# Patient Record
Sex: Female | Born: 1945 | Race: White | Hispanic: No | Smoking: Never smoker
Health system: Southern US, Community
[De-identification: ages and names within clinical notes are randomized; demographics above are authoritative.]

## PROBLEM LIST (undated history)

## (undated) DIAGNOSIS — I10 Essential (primary) hypertension: Secondary | ICD-10-CM

## (undated) DIAGNOSIS — F32A Depression, unspecified: Secondary | ICD-10-CM

## (undated) DIAGNOSIS — T4145XA Adverse effect of unspecified anesthetic, initial encounter: Secondary | ICD-10-CM

## (undated) DIAGNOSIS — F329 Major depressive disorder, single episode, unspecified: Secondary | ICD-10-CM

## (undated) DIAGNOSIS — C44509 Unspecified malignant neoplasm of skin of other part of trunk: Secondary | ICD-10-CM

## (undated) DIAGNOSIS — Z87442 Personal history of urinary calculi: Secondary | ICD-10-CM

## (undated) DIAGNOSIS — R112 Nausea with vomiting, unspecified: Secondary | ICD-10-CM

## (undated) DIAGNOSIS — G4730 Sleep apnea, unspecified: Secondary | ICD-10-CM

## (undated) DIAGNOSIS — Z8489 Family history of other specified conditions: Secondary | ICD-10-CM

## (undated) DIAGNOSIS — E119 Type 2 diabetes mellitus without complications: Secondary | ICD-10-CM

## (undated) DIAGNOSIS — M1990 Unspecified osteoarthritis, unspecified site: Secondary | ICD-10-CM

## (undated) DIAGNOSIS — Z9889 Other specified postprocedural states: Secondary | ICD-10-CM

## (undated) DIAGNOSIS — F419 Anxiety disorder, unspecified: Secondary | ICD-10-CM

## (undated) DIAGNOSIS — R0600 Dyspnea, unspecified: Secondary | ICD-10-CM

## (undated) DIAGNOSIS — T8859XA Other complications of anesthesia, initial encounter: Secondary | ICD-10-CM

## (undated) HISTORY — PX: BREAST SURGERY: SHX581

## (undated) HISTORY — DX: Type 2 diabetes mellitus without complications: E11.9

## (undated) HISTORY — DX: Morbid (severe) obesity due to excess calories: E66.01

## (undated) HISTORY — DX: Depression, unspecified: F32.A

## (undated) HISTORY — PX: KNEE SURGERY: SHX244

## (undated) HISTORY — PX: TUBAL LIGATION: SHX77

## (undated) HISTORY — DX: Major depressive disorder, single episode, unspecified: F32.9

## (undated) HISTORY — PX: EYE SURGERY: SHX253

## (undated) HISTORY — DX: Essential (primary) hypertension: I10

---

## 1999-10-02 ENCOUNTER — Ambulatory Visit (HOSPITAL_COMMUNITY): Admission: RE | Admit: 1999-10-02 | Discharge: 1999-10-02 | Payer: Self-pay | Admitting: Internal Medicine

## 1999-10-02 ENCOUNTER — Encounter: Payer: Self-pay | Admitting: Internal Medicine

## 1999-11-06 ENCOUNTER — Ambulatory Visit (HOSPITAL_COMMUNITY): Admission: RE | Admit: 1999-11-06 | Discharge: 1999-11-06 | Payer: Self-pay | Admitting: Internal Medicine

## 1999-11-06 ENCOUNTER — Encounter: Payer: Self-pay | Admitting: Internal Medicine

## 2000-05-11 ENCOUNTER — Encounter: Payer: Self-pay | Admitting: Endocrinology

## 2000-05-11 ENCOUNTER — Encounter: Payer: Self-pay | Admitting: Emergency Medicine

## 2000-05-11 ENCOUNTER — Inpatient Hospital Stay (HOSPITAL_COMMUNITY): Admission: EM | Admit: 2000-05-11 | Discharge: 2000-05-13 | Payer: Self-pay | Admitting: Emergency Medicine

## 2000-05-12 ENCOUNTER — Encounter: Payer: Self-pay | Admitting: Internal Medicine

## 2001-12-29 ENCOUNTER — Other Ambulatory Visit: Admission: RE | Admit: 2001-12-29 | Discharge: 2001-12-29 | Payer: Self-pay | Admitting: Internal Medicine

## 2003-06-28 ENCOUNTER — Other Ambulatory Visit: Admission: RE | Admit: 2003-06-28 | Discharge: 2003-06-28 | Payer: Self-pay | Admitting: Internal Medicine

## 2004-02-20 ENCOUNTER — Emergency Department (HOSPITAL_COMMUNITY): Admission: EM | Admit: 2004-02-20 | Discharge: 2004-02-20 | Payer: Self-pay | Admitting: Family Medicine

## 2004-05-04 ENCOUNTER — Observation Stay (HOSPITAL_COMMUNITY): Admission: RE | Admit: 2004-05-04 | Discharge: 2004-05-05 | Payer: Self-pay | Admitting: Orthopedic Surgery

## 2004-12-29 ENCOUNTER — Emergency Department (HOSPITAL_COMMUNITY): Admission: EM | Admit: 2004-12-29 | Discharge: 2004-12-29 | Payer: Self-pay | Admitting: Emergency Medicine

## 2005-01-03 ENCOUNTER — Ambulatory Visit (HOSPITAL_COMMUNITY): Admission: RE | Admit: 2005-01-03 | Discharge: 2005-01-03 | Payer: Self-pay | Admitting: Emergency Medicine

## 2005-02-11 ENCOUNTER — Other Ambulatory Visit: Admission: RE | Admit: 2005-02-11 | Discharge: 2005-02-11 | Payer: Self-pay | Admitting: Gynecology

## 2005-02-11 ENCOUNTER — Encounter: Admission: RE | Admit: 2005-02-11 | Discharge: 2005-02-11 | Payer: Self-pay | Admitting: Internal Medicine

## 2007-09-10 ENCOUNTER — Encounter: Admission: RE | Admit: 2007-09-10 | Discharge: 2007-09-10 | Payer: Self-pay | Admitting: Gynecology

## 2007-11-10 ENCOUNTER — Observation Stay (HOSPITAL_COMMUNITY): Admission: EM | Admit: 2007-11-10 | Discharge: 2007-11-11 | Payer: Self-pay | Admitting: Emergency Medicine

## 2007-11-10 ENCOUNTER — Ambulatory Visit: Payer: Self-pay | Admitting: Cardiology

## 2007-11-11 ENCOUNTER — Encounter (INDEPENDENT_AMBULATORY_CARE_PROVIDER_SITE_OTHER): Payer: Self-pay | Admitting: Internal Medicine

## 2007-11-11 ENCOUNTER — Ambulatory Visit: Payer: Self-pay | Admitting: Vascular Surgery

## 2007-12-11 ENCOUNTER — Encounter (INDEPENDENT_AMBULATORY_CARE_PROVIDER_SITE_OTHER): Payer: Self-pay | Admitting: Gynecology

## 2007-12-11 ENCOUNTER — Ambulatory Visit (HOSPITAL_BASED_OUTPATIENT_CLINIC_OR_DEPARTMENT_OTHER): Admission: RE | Admit: 2007-12-11 | Discharge: 2007-12-11 | Payer: Self-pay | Admitting: Dermatology

## 2010-11-20 NOTE — Discharge Summary (Signed)
NAMEJANIJAH, SYMONS            ACCOUNT NO.:  [id number]   MEDICAL RECORD NO.:  [id number]          PATIENT TYPE:  OBV   LOCATION:  3734                         FACILITY:  [facility]   PHYSICIAN:  Marcellus Murata, MD     DATE OF BIRTH:  [birth year]   DATE OF ADMISSION:  11/10/2007  DATE OF DISCHARGE:  11/11/2007                               DISCHARGE SUMMARY   PRIMARY MEDICAL DOCTOR:  Georgianne Fick, M.D.   DISCHARGE DIAGNOSES:  1. Atypical chest pain, likely noncardiac.  2. Left anterior neck pain, unclear etiology.  3. Hypertension.  4. Obesity.  5. Dyslipidemia.  6. Anemia.   DISCHARGE MEDICATIONS:  1. Quinapril 20 mg p.o. twice daily.  2. Lexapro 20 mg p.o. daily.  3. Aspirin, enteric-coated, 81 mg p.o. daily.  4. Ketoconazole cream 2%, apply to affected areas as directed.  5. Ativan 0.5 mg p.o. b.i.d. p.r.n.  6. Prilosec over-the-counter 20 mg p.o. daily p.r.n.   Except for the Prilosec and Ativan, the rest of them are patient's home  medications.  These were obtained from the patient's pharmacy by the  nurses on admission.   PROCEDURES:  1. Carotid Dopplers, a report is pending.  2. Chest x-ray, which has prominent basilar markings, which are likely      chronic.  No definite acute cardiopulmonary process.   PERTINENT LABS:  TSH 1.920.  Cardiac panel cycled x3 and negative.  Comprehensive metabolic panel unremarkable with BUN of 10, creatinine  0.77.  Lipid panel with cholesterol 201, triglycerides 92, HDL 38, LDL  145, VLDL 18.  CBC is with hemoglobin 11.1, hematocrit 33.3, white blood  cells 6.2, platelets 201.  Urinalysis with 3-6 white blood cells per  high-powered field and a few bacteria.  A D-dimer negative at 0.22.   CONSULTATIONS:  Cardiology, Dr. Myrtis Ser.   HOSPITAL COURSE/PATIENT DISPOSITION:  Please refer to the history and  physical note for initial admission details.  In summary, Ms. Cathlean Cower is  a pleasant 65 year old Caucasian female patient with a  history of  hypertension, hyperlipidemia, vaginal bleeding, who presented with  approximately three or more weeks of pain underneath the left breast and  left anterior neck pain.  On the day of admission, she had persisting  3/10 pain with associated episodes of anxiety with hot and cold  sensation.  The patient was thereby admitted for 23-hour observation to  rule out acute coronary syndrome.   Atypical chest pain:  Patient was admitted to telemetry with no  arrhythmia alarms.  Her cardiac enzymes were cycled and negative.  D-  dimer was negative.  Given her risk factors for obesity and  dyslipidemia, a cardiology consult was obtained.  They evaluated the  patient and have indicated that there is no proof there is any  significant cardiac abnormality, and they suggest no further cardiac  workup except for reassurance.  They suspect that this is probably a  musculoskeletal pain.   Left anterior neck pain:  Again, ongoing for weeks, which is 3/10 in  severity.  There is no tenderness on manual palpation and no goiter or  lymphadenopathy.  The  carotid Dopplers were obtained for completion, the  report of which is pending.  Patient is to call back tomorrow for the  report.   Hypertension, which is fairly controlled:  The patient had initially  indicated that she was on quinapril and hydrochlorothiazide; however,  she apparently is only on quinapril at a higher dose at home.  To  continue home medications.   Obesity:  Patient is to have exercise, dieting and weight loss.   Dyslipidemia:  To consider statins as an outpatient on followup with her  PMD.   Anemia, which is probably related to her vaginal bleeding.  She is to  have surgery in a month's time.   Anxiety disorder:  We will provide a small quantity of Ativan p.r.n.  Patient is to follow up with her primary medical doctor for further  evaluation.    Addendum: Carotid Dopplers:  SUMMARY:    -  Mild mix plaque throughout  bilaterally.     -  Vertebral artery flow antegrade bilaterally.    -  No significant right ICA stenosis noted.    -  No significant left ICA stenosis noted.      Marcellus Scheeler, MD  Electronically Signed     AH/MEDQ  D:  11/11/2007  T:  11/11/2007  Job:  [id number]   cc:   Georgianne Fick, M.D.  Luis Abed, MD, [facility]

## 2010-11-20 NOTE — Consult Note (Signed)
[name], DELANGE NO.:  [id number]   MEDICAL RECORD NO.:  [id number]          PATIENT TYPE:  OBV   LOCATION:  3734                         FACILITY:  [facility]   PHYSICIAN:  Luis Abed, MD, FACCDATE OF BIRTH:  07/07/1946   DATE OF CONSULTATION:  11/11/2007  DATE OF DISCHARGE:  11/11/2007                                 CONSULTATION   SUMMARY OF HISTORY:  Lindsey Sanders is a 65 year old white female who was  admitted through [facility] emergency room secondary to weakness, and  chest discomfort.  Lindsey Sanders states when she got out of bed yesterday  at 5:30.  She felt a wave of weakness that went from her head to her  toes that lasted less than 5 minutes.  With this, she had a sense of  hopelessness like she was not control with what was happening.  She  states that she was oriented to person, place, and time and did not have  any neurological deficit.  She states she rested with improvement.  However, she had two other episodes one right after another.  While  driving to work, she state she had a light spell.  While at work at  approximately 7:00 a.m., she had another episode with standing.  Due to  these feelings, she returned home and her son drove her to the emergency  room around 9 o'clock for further evaluation.  Since her admission, she  states she has continued to have light episodes.  With this, she is also  noticed an anxiousness.  She states that these feelings are similar to  prior feelings that she had with her husband died approximately 4 years  ago, and she was diagnosed with depression and panic attacks.  She  denies any problems with depression; however, she states she is under a  lot of stress at work.  She has recently had fired 2 people and more  more demands have been made of her at work.  Her husband's birthday was  also within the last couple weeks and she states that was very bad time  for her.   ALLERGIES:  She is allergic to:  1.  CODEINE.  2. PENICILLIN.  3. ZESTRIL.   MEDICATIONS:  Prior to admission include:  1. Ketoconazole p.r.n.  2. Quinapril 20 b.i.d.  3. Meclizine 25 p.r.n.  4. Aspirin 81 daily.  5. Lexapro 20 daily.  6. Tagamet p.r.n.   PAST MEDICAL HISTORY:  1. She relates a history of hypertension.  2. Obesity.  3. Hyperlipidemia.  4. Benign right breast biopsy.  5. Bilateral renal cyst.  6. Uterine leiomyomata.  7. Right knee surgery in 2006.  8. She is specifically denies diabetes, myocardial infarction, CVA,      COPD, thyroid dyscrasias, renal dysfunction, or bleeding disorders.   SOCIAL HISTORY:  She resides in [place] alone.  She has 1 son, 2  grandchildren.  She has been widowed for 4 years.  She was previously  employed with [employer] at the time of the bankruptcy, now she works  with an Insurance claims handler.  She denies any tobacco,  alcohol,  drug usage.  She has not exercised since November 2007 secondary to her  problems with vaginal bleeding.  She denies any alcohol, drugs, or  specific diet.   FAMILY HISTORY:  Mother died at the age of 64 with a history of  diabetes, Alzheimer and Meniere.  Her father died at the age 88 with  COPD.  She has 9 brothers and sisters that are living.  Two siblings  died in infancy.   REVIEW OF SYSTEMS:  In addition to the above is notable anywhere from a  30- to 60-pound weight gain since 2006, reading glasses.  Recent upper  respiratory infection for which she had a cough and dyspnea on exertion,  but since this has resolved, she has not had any of these difficulties.  She also describes rashes underneath her breast and groin which she was  told that they were similar to Jock itch.  She also notices a sharp type  of chest discomfort that is particularly notable when she twist and  turn.  She has not had any limiting factors such as chest discomfort or  shortness of breath with activities.  She is also postmenopausal,  nocturia, and  GERD.   PHYSICAL EXAMINATION:  GENERAL:  Well-nourished and well-developed obese  white female, in no apparent distress.  VITAL SIGNS:  Temperature is 97.5, pulse 72, respirations 18, blood  pressure 121/82, T max is 99.2, and 96% sats on room air.  Admission  weight 117.2 kg.  HEENT:  Grossly unremarkable.  NECK:  Supple without thyromegaly, adenopathy, JVD, or carotid bruits.  CHEST:  Symmetrical excursion.  LUNGS:  Clear to auscultation.  HEART:  PMI is not displaced.  Regular rate and rhythm.  Normal S1 and  S2.  Do not appreciate any murmurs, rubs, clicks, or gallops.  All  pulses are symmetrical and intact.  SKIN:  Integument appears be intact.  ABDOMEN:  Obese.  Bowel sounds present without organomegaly, masses, or  tenderness.  EXTREMITIES:  Negative for cyanosis, clubbing, or edema.  MUSCULOSKELETAL:  Grossly unremarkable, although she does have  reproducible chest discomfort with palpation.  NEURO:  Unremarkable.   LABORATORY DATA:  Chest x-ray no active disease.  Abdominopelvic CT  showed bilateral renal cysts small uterine fibroids.  EKG shows normal  sinus rhythm, normal axis unchanged from prior EKG November 2005.  H and  H 11.8 and 35.2, normal indices, platelets 208, and WBC 7.2.  Sodium  140, potassium 3.9, BUN 10, creatinine 0.77, glucose 110, normal LFTs,  and D-dimer 0.22.  CK-MBs, relative indexes, and troponins have been  within normal limits x3.  Total cholesterol 201, triglycerides 92, HDL  38, and LDL 145.   IMPRESSION:  1. Weakness/panic attacks of uncertain etiology.  2. Probable musculoskeletal discomfort as it is reproduced on exam.      EKG enzymes, the patient's history does not point to an ischemic      etiology.   DISPOSITION:  Dr. Myrtis Ser reviewed the patient's history, spoke with and  examined the patient and agrees with the above.  Not feel that there is  need to pursue further cardiac workup at this time although, provide  reassurance.  She   should follow up with her primary care physician for aggressive risk  factor management, i.e., weight loss and regular exercise if her  Dopplers were okay.  If patient wishes to follow up with cardiology, Dr.  Myrtis Ser said he would be happy to see her in the office.  Joellyn Rued, PA-C      Luis Abed, MD, [facility]  Electronically Signed    EW/MEDQ  D:  11/11/2007  T:  11/12/2007  Job:  [id number]

## 2010-11-20 NOTE — Op Note (Signed)
NAMEJOLA, CRITZER            ACCOUNT NO.:  [id number]   MEDICAL RECORD NO.:  [id number]          PATIENT TYPE:  AMB   LOCATION:  NESC                         FACILITY:  [facility]   PHYSICIAN:  Gretta Cool, M.D. DATE OF BIRTH:  1946-06-19   DATE OF PROCEDURE:  12/11/2007  DATE OF DISCHARGE:                               OPERATIVE REPORT   PREOPERATIVE DIAGNOSIS:  Endometrial polyps with abnormal uterine  bleeding.   POSTOPERATIVE DIAGNOSIS:  Endometrial polyps with abnormal uterine  bleeding.   PROCEDURE:  Hysteroscopy, resection of multiple endometrial polyps,  total endometrial resection for ablation plus VaporTrode.   SURGEON:  Gretta Cool, M.D.   ANESTHESIA:  LMA, general, plus local infiltration.   DESCRIPTION OF PROCEDURE:  Under excellent anesthesia, as above, with  the patient prepped and draped in Allen stirrups, the bladder drained, a  single-tooth tenaculum was applied to the cervix and the cervix  progressively dilated with a series of Pratt dilators to 33.  The 7-mm  resectoscope was then introduced and the cavity photographed.  Multiple  large polyps were noted in the fundus of the uterus and one small one at  the cervix.  The polyps were aggressively resected.  The entire  endometrial cavity was then resected to a depth of 5 mm or more.  At  this point, VaporTrode electrode was applied and the cornual areas  treated by touch technique.  The entire cavity was then treated with  VaporTrode so as to eliminate any islands of active endometrium in the  superficial myometrium.  At this point with reduced pressure, there was  no significant bleeding.  The procedure was terminated without  complication.   The patient returned to the recovery room in excellent condition.           ______________________________  Gretta Cool, M.D.     CWL/MEDQ  D:  12/11/2007  T:  12/11/2007  Job:  [id number]   cc:   Georgianne Fick, M.D.  Fax: [phone]

## 2010-11-20 NOTE — H&P (Signed)
Lindsey Sanders            ACCOUNT NO.:  [id number]   MEDICAL RECORD NO.:  [id number]          PATIENT TYPE:  EMS   LOCATION:  MAJO                         FACILITY:  [facility]   PHYSICIAN:  Marcellus Laboy, MD     DATE OF BIRTH:  1946-03-27   DATE OF ADMISSION:  11/10/2007  DATE OF DISCHARGE:                              HISTORY & PHYSICAL   PRIMARY MEDICAL DOCTOR:  Georgianne Fick, M.D.   CHIEF COMPLAINT:  Chest pain, left-sided neck pain.   HISTORY OF PRESENTING ILLNESS:  [name] is a pleasant 65 year old  Caucasian female patient with history of hypertension, hyperlipidemia, a  renal cyst, vaginal bleeding that is being evaluated by her  gynecologist.  For the last two or three weeks, patient has been  experiencing pain underneath the left breast, which is intermittent  dull, aching 3/10 at its worst  Unclear about relationship to exertion,  but no pleuritic nature and nonradiating.  This has not been associated  with dyspnea.  She also complains of pain in the left anterior neck,  which is sharp, again 3/10 in severity, nonradiating.  She indicates  that over the last two to three days her chest pain has been more  constant and slightly worse than before.  In any event, patient went to  bed last night and woke up this morning with no symptoms. At  approximately 5:30 a.m, when she stood out of her bed, she started  having the dull aching underneath her left breast and was soon  afterwards followed by a hot and a cold sensation all over her body, and  felt she was going to pass out.  She sat down and her symptoms subsided.  These symptoms recurred through the course of the morning three more  times.  She in fact went to work where she also had associated hot and  cold sensation.  She indicates that these are similar to her panic  attacks that she had when she first was diagnosed with depression soon  after the passing of her husband four years ago.  Following this,  her  son brought her to the emergency room for further evaluation and  management.  She said she still has some amount of chest discomfort  intermittently.  She indicates that she has been stressed over the last  couple of weeks at her job.  There is no history of recent long distance  travel.  Apart from that, she denies any other complaints.   PAST MEDICAL HISTORY:  1. Hypertension.  2. Hyper lipidemia.  3. Renal cyst.  4. Fibroid uterus with vaginal bleeding.   PAST SURGICAL HISTORY:  1. Right breast cyst removal in the 80s, which was benign.  2. Right knee surgery.  3. History of outpatient uterine procedure and is supposed to have      another one done on December 11, 2007.    Psychiatric: history of depression.   ALLERGIES:  PENICILLIN - PASSES OUT.   MEDICATIONS:  1. Lexapro 10 mg p.o. daily.  2. Quinapril with HCTZ 20/12.5 one p.o. daily.  3. Aspirin 81 mg p.o. daily.  However, patient took 2 full strength      aspirins last night.   FAMILY HISTORY:  1. Mother died at age 12 of unclear reasons.  Patient thinks it might      have been heart disease.  Patient's mother also had Alzheimer's      dementia.  2. The patient's father died at age 5 years from emphysema.   SOCIAL HISTORY:  Patient is widowed.  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  There is no history of smoking, alcohol or drug  abuse.  Patient is menopausal for 20 years.   REVIEW OF SYSTEMS:  Weight gain of about 30 pounds over the last four  years secondary to immobility after her right knee surgery and  depression.  Fourteen  systems reviewed and apart from history of presenting illness,  is noncontributory.   PHYSICAL EXAMINATION:  [name] is a moderately built and obese  female.  Patient in no obvious distress.  VITAL SIGNS:  Temperature 98.6 degrees Fahrenheit, blood pressure 127/68  mm/Hg, pulse 84 per minute, regular, respirations 16 per minute and  saturated at 98% on room  air.  HEAD/EYE/ENT:  Nontraumatic, normocephalic.  Pupils equally reacting to  light and accommodation.  NECK:  Thick.  Supple, No JVD or carotid bruit.  LYMPHATICS:  No lymphadenopathy.  BREAST:  Exam deferred.  RESPIRATORY SYSTEM:  Clear to auscultation bilaterally.  CARDIOVASCULAR SYSTEM:  S1, s2 heard.  No S3, S4 or murmurs.  ABDOMEN:  Obese.  Nondistended, nontender.  No organomegaly or mass  appreciated.  Bowel sounds are normally heard.  CENTRAL NERVOUS SYSTEM:  Patient is awake, alert, oriented x3.  No focal  neurological deficits.  EXTREMITIES:  No cyanosis, clubbing or edema.  Peripheral pulses felt  symmetrically.  SKIN:  Without any rashes.   PERTINENT LABORATORY DATA:  Urinalysis with 3 to 6 white blood cells per  high-powered field and a few bacteria, but negative for protein.  Point-  of-care cardiac markers x2 are negative.  D-dimer, negative at 0.22.  Electrolytes on i-STAT with sodium of 139, potassium 3.9, chloride 103,  glucose 125, BUN 11, creatinine 0.8.  CBC with hemoglobin 11.8,  hematocrit 35.2, white blood cells 7.2, platelets 208.  Chest x-ray has been reviewed by me and preliminarily reported as  prominent basilar markings, which are likely chronic - no definitive  acute cardiopulmonary process.  EKG:  Normal sinus rhythm.  No acute changes.   ASSESSMENT/PLAN:  1. Atypical chest pain - will admit to telemetry for 23 hours      observation to rule out acute coronary syndrome.  The coronary      artery disease risk factors in this patient include her obesity and      hyperlipidemia.  The other differential diagnoses for her chest      pain could be musculoskeletal chest pain worsened by her anxiety      disorder.  We will cycle cardiac enzymes and place patient on      p.r.n. nitroglycerin, Aspirin and analgesics.  2. Left anterior neck pain unclear etiology.  Question musculoskeletal      in origin.  3. Hypertension, controlled.  Continue home  medications.  4. Hyperlipidemia - check fasting lipids.  5. Obesity.  6. Normocytic anemia.  Question secondary to dysfunctional uterine      bleeding.  To follow up CBCs in the morning.   If the patient rules out for a myocardial infarction and is improved,  patient will be discharged tomorrow and follow up as an outpatient to  consider an outpatient stress test if necessary.      Marcellus Klebba, MD  Electronically Signed     AH/MEDQ  D:  11/10/2007  T:  11/10/2007  Job:  [id number]   cc:   Georgianne Fick, M.D.

## 2010-11-23 NOTE — H&P (Signed)
[facility]  Patient:    Lindsey Sanders, Lindsey Sanders                     MRN: [id number] Adm. Date:  [id number] Attending:  Vashti Hey Dictator:   Lillia Carmel Barefoot, P.A.                         History and Physical  DATE OF BIRTH:  18-Apr-1946  CHIEF COMPLAINT:  Chest pain, back pain, feel like passing out.  HISTORY OF PRESENT ILLNESS:  This 65 year old white female patient of Dr. Lendell Caprice with history of obesity and hypertension presents to ER yesterday with the above complaints describes feeling ill for approximately one week. She has experienced vertigo during the week, describing this as her environment spinning.  This has occurred episodically and does not relate it to positional changes.  She has had this before and does report it as being similar to previous inner ear problems that she has had.  Also she has had sharp pleuritic pain in the left upper back along her shoulder blade.  I thought that she may have had pleurisy, but has had very rare and infrequent cough.  She denies any rash.  She was able to push on her back and reproduce pain this week.  Also she describes a nagging pain like an electrical shock at her left chest.  This is also nonexertional and has not been associated with shortness of breath, nausea, vomiting, or diaphoresis.  It has lasted several minutes when it does occur and may occur once or twice daily.  On one occasion yesterday, she had a sharp pain at the left chest that radiated into her left neck and down her left arm into her hand.  She also had increasing fatigue yesterday and felt as though "she may pass out."  In the emergency room her EKG showed nonspecific changes and her labs including cardiac enzymes and troponin were negative.  She has also had risk factors for heart disease including hypertension and morbid obesity and has been admitted for full evaluation.  CURRENT MEDICATIONS:  Altace,  will need a confirmed dosage with office.  I believe it is 5 mg daily.  ALLERGIES: 1. CODEINE, nausea. 2. PENICILLIN INJECTION caused syncope and rash. 3. ZESTRIL, hypotension.  PAST MEDICAL HISTORY:  Morbid obesity, hypertension, heel spur with plantar fasciitis, benign right breast biopsy, renal colic, bilateral renal cyst, uterine leiomyomata.  FAMILY HISTORY:  Mother alive at 40 with Alzheimers, diabetes, heart disease, and pacemaker.  Father deceased at 22 with emphysema.  She has nine brothers and sisters who are alive and well, although one brother does experience severe vertigo, dizziness that its etiology has been undiagnosed, despite various forms of testing.  She also has two siblings who died very young of pneumonia.  SOCIAL HISTORY:  She is married with one 15 year old son.  She does care for her mother who has Alzheimers dementia, also works at [employer].  She denies tobacco or alcohol use.  REVIEW OF SYSTEMS:  Negative for any cold symptoms, earache, nasal congestion, change of vision or speech, any unilateral muscle weakness, abdominal pains, change in bowel or bladder habits, melena, hematochezia, hematuria, peripheral edema, fever, or chills.  Positive as described above in history of present illness and also reports a very occasional dry cough.  LABORATORY:  CK-MB values 1.2, 0.6, and 0.4.  O2 saturation 90% on room air. CBC  normal with white count 8.3, hemoglobin 12.8, and platelets 235.  Troponin I less than 0.01 x 3.  Urinalysis 6-10 white blood cells.  A chest x-ray, results have not been read.  PHYSICAL EXAMINATION:  VITAL SIGNS:  Pulse 86, respirations 16, temperature 97.5, blood pressure 158/96, weight 235.3 pounds.  GENERAL:  A pleasant obese, drowsy appearing white female who was resting comfortably supine.  She was actually sleeping when I entered her room, but does easily awaken, breathing comfortably in no acute distress.  HEENT:   Normocephalic, atraumatic, TMs are clear.  Oropharynx clear, moist mucosa without lesions.  Sinuses nontender.  NECK:  Supple without lymphadenopathy, thyromegaly, or bruit.  LUNGS:  Clear to auscultation bilaterally with good breath sounds throughout.  CARDIOVASCULAR:  Normal sinus rhythm, no murmur, rub, or gallop.  ABDOMEN:  Obese.  Bowel sounds are present throughout, soft, nontender, nondistended, no _________ or organomegaly.  GENITOURINARY/RECTAL:  Deferred, will order occult stool checks.  EXTREMITIES:  Without edema.  NEUROLOGIC:  Cranial nerves 2-12 are grossly intact.  She has normal speech. There is no tremor.  DTRs in knees and biceps are equal and 2+.  Gait is not tested.  SKIN:  Warm and dry.  There is no rash.  MUSCULOSKELETAL:  She is completely nontender to palpation of the cervical or thoracic spine, upper back, parascapular area, and chest.  ASSESSMENT: 1. Chest pain, rule out myocardial infarction. 2. Vertigo. 3. Morbid obesity. 4. Hypertension, treated with Altace. 5. History of heel spur with plantar fasciitis. 6. Status post benign right breast biopsy. 7. History of renal colic. 8. Bilateral renal cyst. 9. Uterine leiomyomatas.  PLAN:  Will admit to Dr. Lendell Caprice.  Will place on cardiac monitor and continue to monitor serial cardiac enzymes.  Will also continue her medications, but will need to confirm her dosage of Altace, believe in fact she is on 5 mg daily.  Also note that Dr. Lendell Caprice has ordered a spiral CT of the chest to rule out occult pulmonary embolus, possible pneumothorax. DD:  05/12/00 TD:  05/12/00 Job: [id number]

## 2010-11-23 NOTE — Discharge Summary (Signed)
[place]  Patient:    Lindsey Sanders, Lindsey Sanders                     MRN: [id number] Adm. Date:  [id number] Disc. Date: [id number] Attending:  Vashti Hey                           Discharge Summary  FINAL DIAGNOSES: 1. Noncardiac chest and back pain. 2. Paresthesias of the left arm and hand. 3. Hypertension.  HISTORY OF PRESENT ILLNESS:  This 65 year old white female presented to the emergency room with complaint of illness for about a week, some dizziness, and some sharp pleuritic pain in the left upper back near the shoulder blade. Also an electric shock type pain in the left chest which was nonexertional and not associated with dyspnea.  She had another episode of left chest pain radiating into the neck and down the left arm with increasing fatigue.  EKG in the emergency room showed nonspecific changes but she was admitted for further evaluation and observation.  HOSPITAL COURSE: The patient was placed on a monitored bed on a regular floor. She had some minimal discomforts during her stay and vital signs remained quite stable. She had serial EKGs and enzymes and showed no evidence of cardiac damage. She also underwent a CT scan of her chest which showed no evidence of any pulmonary emboli, infusions or infiltrates.  Her blood pressure was addressed and she was continued on her Altace 10 mg daily.  She was placed on intravenous heparin and this was monitored through the pharmacy services.  By the second hospital day, it was determined that there was no acute coronary source for her problem and it was felt that she could safely be discharged home and continue with monitoring an maintenance of her ongoing problems as an outpatient.  LABORATORY DATA:  Her ISTAT in the emergency room showed a glucose of 126, pH 7.38, pCO2 of 48, bicarbonate of 29.  Her creatinine was 1.0. CK-MB initially negative, troponin negative for any injury.  Her  white count was 8300 with hemoglobin 12.8, hematocrit 36.5%, platelet count 235,000.  Urinalysis showed a small amount of leukocyte esterase, 6 to 10 wbc, rare bacteria, no rbc. Follow-up CK and troponin was also negative.  Her arterial blood gases on room air showed pH 7.37, pCO2 45, pO2 81, bicarbonate 25.  A third set of CK-MB and troponin I were also normal.  Electrolytes were normal with potassium of 3.7, glucose down to 112, BUN 12, creatinine 0.6.  Liver functions were normal. Magnesium normal at 1.9.  Prothrombin time was normal.  CONSULTATIONS:  None.  OPERATIONS:  None.  TRANSFUSION:  None.  NOSOCOMIAL INFECTIONS:  None.  EKG:  Normal sinus rhythm with nonspecific ST abnormalities was the only thing found.  RECOMMENDATION ON DISCHARGE:  The patient was advised to continue on Altace 10 mg daily, Vicodin twice daily as needed for pain.  Activity as desired. Return to work next week.  She will be on a low fat, reduced calorie diet.  FOLLOW-UP:  She will see me in the office in two to three weeks.  CONDITION ON DISCHARGE:  Improved. DD:  05/13/00 TD:  05/13/00 Job: [id number]

## 2010-11-23 NOTE — Op Note (Signed)
Lindsey Sanders, Lindsey Sanders            ACCOUNT NO.:  [id number]   MEDICAL RECORD NO.:  [id number]          PATIENT TYPE:  AMB   LOCATION:  DAY                          FACILITY:  [facility]   PHYSICIAN:  Marlowe Kays, M.D.  DATE OF BIRTH:  June 19, 1946   DATE OF PROCEDURE:  05/04/2004  DATE OF DISCHARGE:                                 OPERATIVE REPORT   PREOPERATIVE DIAGNOSES:  1.  Torn medial meniscus.  2.  Degenerative arthritis right knee.   POSTOPERATIVE DIAGNOSES:  1.  Torn medial meniscus.  2.  Degenerative arthritis right knee.   OPERATION:  Right knee arthroscopy, with partial medial meniscectomy and  debridement of medial femoral condyle and lateral tibial plateau.   SURGEON:  Marlowe Kays, M.D.   ASSISTANT:  Nurse.   ANESTHESIA:  General.   PATHOLOGY AND JUSTIFICATION FOR PROCEDURE:  She had an on-the-job injury  right knee on February 17, 2004, leading to an MRI on March 24, 2004 which  showed that above-mentioned problems.  She is here today for surgical  treatment accordingly.  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   PROCEDURE:  Satisfactory general anesthesia, pneumatic tourniquet, thigh  stabilizer.  Right knee was prepped and draped in a sterile field.  Ace wrap  and knee support on the left.  Superomedial saline inflow.  First through an  anteromedial port, lateral compartment of the knee joint was evaluated.  She  had articular fluid with some articular cartilage degeneration products in  the joint and some mild synovitis which I resected for visualization  purposes.  The lateral meniscus was intact.  The lateral tibial plateau was  roughened, as noted, and I shaved it down to smooth with a 3.5 shaver.  I  looked at the lateral  gutter and suprapatellar area, no abnormalities were  noted.  I then reversed portals.  The abnormality in the medial femoral  condyle was noted and smoothed down with a  3.5 shaver.  The posterior horn  of the medial meniscus I managed by resecting the torn portion and shaving  it down with a 3.5 shaver with pre and post films being taken.  The knee  joint was then irrigated until clear and all fluid possible removed.  The  two anterior portals were closed with 4-0 nylon; 20 cc of 0.5% Marcaine with  adrenaline and 4 mg of morphine  were then instilled through the inflow apparatus, which was removed, and the  portal was closed with 4-0 nylon as well.  Betadine and Adaptic dressing  were applied.  Tourniquet was released.  At the time of this dictation, she  was on her way to recovery in satisfactory condition, with no complications.      JA/MEDQ  D:  05/04/2004  T:  05/04/2004  Job:  [id number]

## 2011-03-29 ENCOUNTER — Emergency Department (HOSPITAL_COMMUNITY)
Admission: EM | Admit: 2011-03-29 | Discharge: 2011-03-30 | Disposition: A | Discharge status: Home / Self Care | Payer: BC Managed Care – PPO | Attending: Emergency Medicine | Admitting: Emergency Medicine

## 2011-03-29 ENCOUNTER — Emergency Department (HOSPITAL_COMMUNITY): Payer: BC Managed Care – PPO

## 2011-03-29 DIAGNOSIS — J40 Bronchitis, not specified as acute or chronic: Secondary | ICD-10-CM | POA: Insufficient documentation

## 2011-03-29 DIAGNOSIS — I10 Essential (primary) hypertension: Secondary | ICD-10-CM | POA: Insufficient documentation

## 2011-03-29 DIAGNOSIS — R059 Cough, unspecified: Secondary | ICD-10-CM | POA: Insufficient documentation

## 2011-03-29 DIAGNOSIS — R0602 Shortness of breath: Secondary | ICD-10-CM | POA: Insufficient documentation

## 2011-03-29 DIAGNOSIS — R0789 Other chest pain: Secondary | ICD-10-CM | POA: Insufficient documentation

## 2011-03-29 DIAGNOSIS — R05 Cough: Secondary | ICD-10-CM | POA: Insufficient documentation

## 2011-04-03 LAB — LIPID PANEL
HDL: 38 — ABNORMAL LOW
Total CHOL/HDL Ratio: 5.3
Triglycerides: 92
VLDL: 18

## 2011-04-03 LAB — CBC
HCT: 33.3 — ABNORMAL LOW
HCT: 35.2 — ABNORMAL LOW
Hemoglobin: 11.1 — ABNORMAL LOW
MCHC: 33.5
MCV: 86.9
Platelets: 201
Platelets: 208
RBC: 3.81 — ABNORMAL LOW
RBC: 4.04
RDW: 13.6
RDW: 13.7
WBC: 6.2

## 2011-04-03 LAB — COMPREHENSIVE METABOLIC PANEL
AST: 18
CO2: 28
Calcium: 9.1
Chloride: 104
Glucose, Bld: 110 — ABNORMAL HIGH
Potassium: 3.9
Sodium: 140
Total Protein: 6.7

## 2011-04-03 LAB — URINALYSIS, ROUTINE W REFLEX MICROSCOPIC
Bilirubin Urine: NEGATIVE
Ketones, ur: NEGATIVE
Protein, ur: NEGATIVE
Urobilinogen, UA: 0.2

## 2011-04-03 LAB — POCT I-STAT, CHEM 8
Calcium, Ion: 1.16
Chloride: 103
Creatinine, Ser: 0.8
HCT: 37
Hemoglobin: 12.6
Potassium: 3.9
Sodium: 139

## 2011-04-03 LAB — CARDIAC PANEL(CRET KIN+CKTOT+MB+TROPI)
CK, MB: 0.8
Relative Index: INVALID
Total CK: 59
Troponin I: 0.01

## 2011-04-03 LAB — CK TOTAL AND CKMB (NOT AT ARMC)
Relative Index: INVALID
Total CK: 58

## 2011-04-03 LAB — POCT CARDIAC MARKERS
Operator id: 285491
Troponin i, poc: <0.05

## 2011-04-03 LAB — DIFFERENTIAL
Monocytes Absolute: 0.4
Neutro Abs: 5.6

## 2011-04-03 LAB — TSH: TSH: 1.92

## 2011-04-03 LAB — TROPONIN I: Troponin I: 0.02

## 2011-04-03 LAB — D-DIMER, QUANTITATIVE: D-Dimer, Quant: 0.22

## 2011-04-04 LAB — POCT I-STAT 4, (NA,K, GLUC, HGB,HCT)
HCT: 38
Operator id: 133231
Sodium: 140

## 2011-11-28 DIAGNOSIS — R0989 Other specified symptoms and signs involving the circulatory and respiratory systems: Secondary | ICD-10-CM | POA: Diagnosis not present

## 2011-11-28 DIAGNOSIS — E782 Mixed hyperlipidemia: Secondary | ICD-10-CM | POA: Diagnosis not present

## 2011-11-28 DIAGNOSIS — R0609 Other forms of dyspnea: Secondary | ICD-10-CM | POA: Diagnosis not present

## 2011-11-28 DIAGNOSIS — R079 Chest pain, unspecified: Secondary | ICD-10-CM | POA: Diagnosis not present

## 2012-02-28 DIAGNOSIS — G4761 Periodic limb movement disorder: Secondary | ICD-10-CM | POA: Diagnosis not present

## 2012-02-28 DIAGNOSIS — G4733 Obstructive sleep apnea (adult) (pediatric): Secondary | ICD-10-CM | POA: Diagnosis not present

## 2012-06-10 DIAGNOSIS — M545 Low back pain: Secondary | ICD-10-CM | POA: Diagnosis not present

## 2012-06-10 DIAGNOSIS — H109 Unspecified conjunctivitis: Secondary | ICD-10-CM | POA: Diagnosis not present

## 2012-06-10 DIAGNOSIS — I10 Essential (primary) hypertension: Secondary | ICD-10-CM | POA: Diagnosis not present

## 2012-06-10 DIAGNOSIS — H8110 Benign paroxysmal vertigo, unspecified ear: Secondary | ICD-10-CM | POA: Diagnosis not present

## 2012-09-01 DIAGNOSIS — D239 Other benign neoplasm of skin, unspecified: Secondary | ICD-10-CM | POA: Diagnosis not present

## 2012-09-01 DIAGNOSIS — L821 Other seborrheic keratosis: Secondary | ICD-10-CM | POA: Diagnosis not present

## 2012-09-01 DIAGNOSIS — L538 Other specified erythematous conditions: Secondary | ICD-10-CM | POA: Diagnosis not present

## 2012-09-17 ENCOUNTER — Encounter: Payer: Self-pay | Admitting: *Deleted

## 2012-09-17 ENCOUNTER — Encounter: Payer: BC Managed Care – PPO | Attending: Internal Medicine | Admitting: *Deleted

## 2012-09-17 VITALS — Ht 64.0 in | Wt 281.7 lb

## 2012-09-17 DIAGNOSIS — E119 Type 2 diabetes mellitus without complications: Secondary | ICD-10-CM

## 2012-09-17 DIAGNOSIS — Z713 Dietary counseling and surveillance: Secondary | ICD-10-CM | POA: Insufficient documentation

## 2012-09-17 NOTE — Progress Notes (Signed)
Discussed carb counting, but not in great detail because patient was late to appointment.  She is newly diagnosed DM2 and could use some more instruction.Marland KitchenMarland Kitchen

## 2012-09-17 NOTE — Progress Notes (Signed)
Diabetes Self-Management Education  Visit Number: First/Initial  09/17/2012 [name], identified by name and date of birth, is a 67 y.o. female with Diabetes Type: Type 2.  Other people present during visit:  Other (comment) (no one)   ASSESSMENT  Patient Concerns:  Healthy Lifestyle  Height 5\' 4"  (1.626 m), weight 281 lb 11.2 oz (127.778 kg). Body mass index is 48.33 kg/(m^2).  Lab Results:  HbA1c 7.3%    Support Systems:  Lives Alone Special Needs:  None  Prior DM Education:  none Daily Foot Exams: No Patient Belief / Attitude about Diabetes:  Diabetes can be controlled  Assessment comments: Khalilah is interested in bariatric surgery as a treatment option for her HTN and DM    Individualized Plan for Diabetes Self-Management Training:  Goals:  Follow Diabetes Meal Plan as instructed  Eat 3 meals and 2 snacks, every 3-5 hrs  Limit carbohydrate intake to 30-45 grams carbohydrate/meal  Limit carbohydrate intake to 15 grams carbohydrate/snack  Add lean protein foods to meals/snacks  Monitor glucose levels as instructed by your doctor  Aim for 30 mins of physical activity daily  Bring food record and glucose log to your next nutrition visit    Education Topics Reviewed with Patient Today:  Topic Points Discussed  Disease State  physiology of DM and role of obesity on insulin resistance  Nutrition Management  importance of counting carbohydrates at all meals and snacks- do not cut out carbs, but limit  Physical Activity and Exercise  increase movement in ADLs  Medications  ask about alternative medication that doesn't cause GI upset  Monitoring  consider BGM  Acute Complications  did not address  Chronic Complications  reviewed potential long term complications  Psychosocial Adjustment  discussed need for physco-social support  Goal Setting    Preconception Care (if applicable)  NA     PERSONALIZED PLAN / SUPPORT  Self-Management Support:    (none)   Education material provided:   Living Well with Diabetes  Carb Counting and Food Label handouts  Meal Plan Card   If problems or questions, patient to contact team via:  Phone  Time in: 1600     Time out: 1700  Future DSME appointment: -  will call once she verifies MNT coverage with insurance company   Carrolyn Leigh 09/17/2012 4:36 PM

## 2012-11-04 ENCOUNTER — Encounter: Payer: Self-pay | Admitting: *Deleted

## 2012-11-04 ENCOUNTER — Encounter: Payer: BC Managed Care – PPO | Attending: Internal Medicine | Admitting: *Deleted

## 2012-11-04 VITALS — Ht 63.5 in | Wt 280.5 lb

## 2012-11-04 DIAGNOSIS — Z713 Dietary counseling and surveillance: Secondary | ICD-10-CM | POA: Insufficient documentation

## 2012-11-04 DIAGNOSIS — E119 Type 2 diabetes mellitus without complications: Secondary | ICD-10-CM | POA: Insufficient documentation

## 2012-11-04 NOTE — Progress Notes (Signed)
  T2DM/6 Months Supervised Weight Loss Visit: Pre-Operative Bariatric Surgery  Medical Nutrition Therapy:  Appt start time: 1230 end time:  1300.  Primary concerns today:  T2DM/Supervised Weight Loss Follow-up Visit  Weight today: 280.5 lbs  Weight change: 1.4 lbs Total weight lost: 1.4 lbs BMI: 48.1 kg/m^2  Medications: No changes from previous visit.  CBG monitoring:  None (per MD) Average CBG per patient: N/A Last patient reported A1c: 6.3%  Recent physical activity:  None at this time  Progress Towards Goal(s):  In progress.   Nutritional Diagnosis:  [place]-3.3 Overweight/obesity related to past poor dietary habits and physical inactivity as evidenced by patient attending supervised weight loss visits for insurance approval of bariatric surgery.    Intervention:  Nutrition education including CHO counting and effects of exercise on blood glucose levels. Went over pre-op goals for bariatric surgery.   Monitoring/Evaluation:  Dietary intake, exercise, and body weight. Follow up in 1 months for 4th month supervised weight loss visit.

## 2012-11-04 NOTE — Patient Instructions (Addendum)
Goals:  Follow Diabetes Meal Plan as instructed  Eat 3 meals and 2 snacks, every 3-5 hrs  Limit carbohydrate intake to 30-45 grams carbohydrate/meal  Limit carbohydrate intake to 15 grams carbohydrate/snack  Add lean protein foods to meals/snacks  Monitor glucose levels as instructed by your doctor  Aim for 30 mins of physical activity daily  *Contact Leandrew Koyanagi at [facility] regarding Bariatric Surgery. Best contact for Okey Regal is [email or web address].

## 2012-12-25 DIAGNOSIS — H00029 Hordeolum internum unspecified eye, unspecified eyelid: Secondary | ICD-10-CM | POA: Diagnosis not present

## 2013-04-07 DIAGNOSIS — M79609 Pain in unspecified limb: Secondary | ICD-10-CM | POA: Diagnosis not present

## 2013-04-07 DIAGNOSIS — H538 Other visual disturbances: Secondary | ICD-10-CM | POA: Diagnosis not present

## 2013-04-07 DIAGNOSIS — I10 Essential (primary) hypertension: Secondary | ICD-10-CM | POA: Diagnosis not present

## 2013-04-07 DIAGNOSIS — E782 Mixed hyperlipidemia: Secondary | ICD-10-CM | POA: Diagnosis not present

## 2013-05-04 DIAGNOSIS — H2510 Age-related nuclear cataract, unspecified eye: Secondary | ICD-10-CM | POA: Diagnosis not present

## 2013-05-04 DIAGNOSIS — E119 Type 2 diabetes mellitus without complications: Secondary | ICD-10-CM | POA: Diagnosis not present

## 2013-05-04 DIAGNOSIS — H1010 Acute atopic conjunctivitis, unspecified eye: Secondary | ICD-10-CM | POA: Diagnosis not present

## 2013-05-06 DIAGNOSIS — H26019 Infantile and juvenile cortical, lamellar, or zonular cataract, unspecified eye: Secondary | ICD-10-CM | POA: Diagnosis not present

## 2013-05-06 DIAGNOSIS — H2510 Age-related nuclear cataract, unspecified eye: Secondary | ICD-10-CM | POA: Diagnosis not present

## 2013-05-19 DIAGNOSIS — H2510 Age-related nuclear cataract, unspecified eye: Secondary | ICD-10-CM | POA: Diagnosis not present

## 2013-05-19 DIAGNOSIS — H26019 Infantile and juvenile cortical, lamellar, or zonular cataract, unspecified eye: Secondary | ICD-10-CM | POA: Diagnosis not present

## 2013-05-20 DIAGNOSIS — J029 Acute pharyngitis, unspecified: Secondary | ICD-10-CM | POA: Diagnosis not present

## 2013-05-20 DIAGNOSIS — J310 Chronic rhinitis: Secondary | ICD-10-CM | POA: Diagnosis not present

## 2013-05-20 DIAGNOSIS — R5381 Other malaise: Secondary | ICD-10-CM | POA: Diagnosis not present

## 2013-05-20 DIAGNOSIS — J069 Acute upper respiratory infection, unspecified: Secondary | ICD-10-CM | POA: Diagnosis not present

## 2013-05-26 DIAGNOSIS — H2510 Age-related nuclear cataract, unspecified eye: Secondary | ICD-10-CM | POA: Diagnosis not present

## 2013-05-26 DIAGNOSIS — H26019 Infantile and juvenile cortical, lamellar, or zonular cataract, unspecified eye: Secondary | ICD-10-CM | POA: Diagnosis not present

## 2013-11-24 DIAGNOSIS — I10 Essential (primary) hypertension: Secondary | ICD-10-CM | POA: Diagnosis not present

## 2013-11-24 DIAGNOSIS — IMO0001 Reserved for inherently not codable concepts without codable children: Secondary | ICD-10-CM | POA: Diagnosis not present

## 2013-11-24 DIAGNOSIS — E782 Mixed hyperlipidemia: Secondary | ICD-10-CM | POA: Diagnosis not present

## 2014-01-14 DIAGNOSIS — L259 Unspecified contact dermatitis, unspecified cause: Secondary | ICD-10-CM | POA: Diagnosis not present

## 2014-04-28 DIAGNOSIS — H10021 Other mucopurulent conjunctivitis, right eye: Secondary | ICD-10-CM | POA: Diagnosis not present

## 2014-05-25 DIAGNOSIS — Z23 Encounter for immunization: Secondary | ICD-10-CM | POA: Diagnosis not present

## 2014-05-25 DIAGNOSIS — E782 Mixed hyperlipidemia: Secondary | ICD-10-CM | POA: Diagnosis not present

## 2014-05-25 DIAGNOSIS — I10 Essential (primary) hypertension: Secondary | ICD-10-CM | POA: Diagnosis not present

## 2014-05-25 DIAGNOSIS — E1165 Type 2 diabetes mellitus with hyperglycemia: Secondary | ICD-10-CM | POA: Diagnosis not present

## 2014-09-16 ENCOUNTER — Other Ambulatory Visit: Payer: Self-pay | Admitting: Internal Medicine

## 2014-09-16 DIAGNOSIS — R42 Dizziness and giddiness: Secondary | ICD-10-CM

## 2014-10-01 ENCOUNTER — Ambulatory Visit
Admission: RE | Admit: 2014-10-01 | Discharge: 2014-10-01 | Disposition: A | Discharge status: Home / Self Care | Payer: Medicare Other | Source: Ambulatory Visit | Attending: Internal Medicine | Admitting: Internal Medicine

## 2014-10-01 DIAGNOSIS — R42 Dizziness and giddiness: Secondary | ICD-10-CM | POA: Diagnosis not present

## 2014-10-04 DIAGNOSIS — R42 Dizziness and giddiness: Secondary | ICD-10-CM | POA: Diagnosis not present

## 2014-10-12 DIAGNOSIS — R42 Dizziness and giddiness: Secondary | ICD-10-CM | POA: Diagnosis not present

## 2014-10-19 DIAGNOSIS — R42 Dizziness and giddiness: Secondary | ICD-10-CM | POA: Diagnosis not present

## 2014-10-28 DIAGNOSIS — R42 Dizziness and giddiness: Secondary | ICD-10-CM | POA: Diagnosis not present

## 2014-11-07 DIAGNOSIS — R42 Dizziness and giddiness: Secondary | ICD-10-CM | POA: Diagnosis not present

## 2014-11-14 ENCOUNTER — Ambulatory Visit: Payer: Medicare Other | Admitting: Neurology

## 2014-11-15 DIAGNOSIS — R42 Dizziness and giddiness: Secondary | ICD-10-CM | POA: Diagnosis not present

## 2014-11-16 ENCOUNTER — Telehealth: Payer: Self-pay | Admitting: Neurology

## 2014-11-16 NOTE — Telephone Encounter (Signed)
Pt has cancelled new patient appointment with Dr. Ellouise Newer scheduled for 11/14/14. She did not wish to r/s at this time. / Sherri S. - [facility]  Routed to Dr. Ashby Dawes

## 2015-03-13 ENCOUNTER — Inpatient Hospital Stay (HOSPITAL_COMMUNITY)
Admission: EM | Admit: 2015-03-13 | Discharge: 2015-03-16 | DRG: 193 | Disposition: A | Discharge status: Home / Self Care | Payer: BC Managed Care – PPO | Attending: Internal Medicine | Admitting: Internal Medicine

## 2015-03-13 ENCOUNTER — Encounter (HOSPITAL_COMMUNITY): Payer: Self-pay | Admitting: Emergency Medicine

## 2015-03-13 ENCOUNTER — Emergency Department (HOSPITAL_COMMUNITY): Payer: BC Managed Care – PPO

## 2015-03-13 DIAGNOSIS — Z833 Family history of diabetes mellitus: Secondary | ICD-10-CM | POA: Diagnosis not present

## 2015-03-13 DIAGNOSIS — H5010 Unspecified exotropia: Secondary | ICD-10-CM | POA: Diagnosis present

## 2015-03-13 DIAGNOSIS — E785 Hyperlipidemia, unspecified: Secondary | ICD-10-CM | POA: Insufficient documentation

## 2015-03-13 DIAGNOSIS — K219 Gastro-esophageal reflux disease without esophagitis: Secondary | ICD-10-CM | POA: Diagnosis present

## 2015-03-13 DIAGNOSIS — E662 Morbid (severe) obesity with alveolar hypoventilation: Secondary | ICD-10-CM | POA: Diagnosis present

## 2015-03-13 DIAGNOSIS — J4530 Mild persistent asthma, uncomplicated: Secondary | ICD-10-CM | POA: Diagnosis not present

## 2015-03-13 DIAGNOSIS — Z79899 Other long term (current) drug therapy: Secondary | ICD-10-CM

## 2015-03-13 DIAGNOSIS — J4520 Mild intermittent asthma, uncomplicated: Secondary | ICD-10-CM | POA: Diagnosis not present

## 2015-03-13 DIAGNOSIS — R05 Cough: Secondary | ICD-10-CM | POA: Diagnosis not present

## 2015-03-13 DIAGNOSIS — Z880 Allergy status to penicillin: Secondary | ICD-10-CM

## 2015-03-13 DIAGNOSIS — R0602 Shortness of breath: Secondary | ICD-10-CM | POA: Diagnosis not present

## 2015-03-13 DIAGNOSIS — J9811 Atelectasis: Secondary | ICD-10-CM | POA: Diagnosis present

## 2015-03-13 DIAGNOSIS — Z7951 Long term (current) use of inhaled steroids: Secondary | ICD-10-CM | POA: Diagnosis not present

## 2015-03-13 DIAGNOSIS — E119 Type 2 diabetes mellitus without complications: Secondary | ICD-10-CM

## 2015-03-13 DIAGNOSIS — E1165 Type 2 diabetes mellitus with hyperglycemia: Secondary | ICD-10-CM | POA: Diagnosis present

## 2015-03-13 DIAGNOSIS — Z7952 Long term (current) use of systemic steroids: Secondary | ICD-10-CM

## 2015-03-13 DIAGNOSIS — F329 Major depressive disorder, single episode, unspecified: Secondary | ICD-10-CM | POA: Diagnosis present

## 2015-03-13 DIAGNOSIS — Z23 Encounter for immunization: Secondary | ICD-10-CM | POA: Diagnosis not present

## 2015-03-13 DIAGNOSIS — J189 Pneumonia, unspecified organism: Secondary | ICD-10-CM | POA: Diagnosis not present

## 2015-03-13 DIAGNOSIS — T380X5A Adverse effect of glucocorticoids and synthetic analogues, initial encounter: Secondary | ICD-10-CM | POA: Diagnosis present

## 2015-03-13 DIAGNOSIS — X58XXXA Exposure to other specified factors, initial encounter: Secondary | ICD-10-CM | POA: Diagnosis present

## 2015-03-13 DIAGNOSIS — I10 Essential (primary) hypertension: Secondary | ICD-10-CM | POA: Diagnosis present

## 2015-03-13 DIAGNOSIS — J45901 Unspecified asthma with (acute) exacerbation: Secondary | ICD-10-CM

## 2015-03-13 DIAGNOSIS — Z6841 Body Mass Index (BMI) 40.0 and over, adult: Secondary | ICD-10-CM | POA: Diagnosis not present

## 2015-03-13 DIAGNOSIS — J9601 Acute respiratory failure with hypoxia: Secondary | ICD-10-CM | POA: Diagnosis not present

## 2015-03-13 DIAGNOSIS — J9600 Acute respiratory failure, unspecified whether with hypoxia or hypercapnia: Secondary | ICD-10-CM | POA: Diagnosis present

## 2015-03-13 DIAGNOSIS — J45909 Unspecified asthma, uncomplicated: Secondary | ICD-10-CM | POA: Diagnosis present

## 2015-03-13 LAB — CBC WITH DIFFERENTIAL/PLATELET
Basophils Absolute: 0 10*3/uL (ref 0.0–0.1)
Basophils Relative: 0 % (ref 0–1)
Eosinophils Absolute: 0.1 10*3/uL (ref 0.0–0.7)
Eosinophils Relative: 1 % (ref 0–5)
HEMATOCRIT: 40.3 % (ref 36.0–46.0)
HEMOGLOBIN: 12.7 g/dL (ref 12.0–15.0)
LYMPHS ABS: 2.3 10*3/uL (ref 0.7–4.0)
Lymphocytes Relative: 28 % (ref 12–46)
MCH: 29.4 pg (ref 26.0–34.0)
MCHC: 31.5 g/dL (ref 30.0–36.0)
MCV: 93.3 fL (ref 78.0–100.0)
MONO ABS: 0.6 10*3/uL (ref 0.1–1.0)
MONOS PCT: 7 % (ref 3–12)
NEUTROS ABS: 5.2 10*3/uL (ref 1.7–7.7)
NEUTROS PCT: 64 % (ref 43–77)
Platelets: 183 10*3/uL (ref 150–400)
RBC: 4.32 MIL/uL (ref 3.87–5.11)
RDW: 13.7 % (ref 11.5–15.5)
WBC: 8.2 10*3/uL (ref 4.0–10.5)

## 2015-03-13 LAB — BRAIN NATRIURETIC PEPTIDE: B Natriuretic Peptide: 17.7 pg/mL (ref 0.0–100.0)

## 2015-03-13 LAB — BASIC METABOLIC PANEL
ANION GAP: 11 (ref 5–15)
BUN: 18 mg/dL (ref 6–20)
CHLORIDE: 101 mmol/L (ref 101–111)
CO2: 26 mmol/L (ref 22–32)
CREATININE: 0.91 mg/dL (ref 0.44–1.00)
Calcium: 9.4 mg/dL (ref 8.9–10.3)
GFR calc non Af Amer: >60 mL/min (ref >60)
GLUCOSE: 140 mg/dL — AB (ref 65–99)
Potassium: 3.8 mmol/L (ref 3.5–5.1)
Sodium: 138 mmol/L (ref 135–145)

## 2015-03-13 LAB — D-DIMER, QUANTITATIVE: D-Dimer, Quant: 0.37 ug/mL-FEU (ref 0.00–0.48)

## 2015-03-13 LAB — GLUCOSE, CAPILLARY: GLUCOSE-CAPILLARY: 409 mg/dL — AB (ref 65–99)

## 2015-03-13 LAB — I-STAT TROPONIN, ED: Troponin i, poc: 0.01 ng/mL (ref 0.00–0.08)

## 2015-03-13 MED ORDER — INSULIN ASPART 100 UNIT/ML ~~LOC~~ SOLN
0.0000 [IU] | Freq: Three times a day (TID) | SUBCUTANEOUS | Status: DC
  Administered 2015-03-14 (×2): 8 [IU] via SUBCUTANEOUS
  Administered 2015-03-14 – 2015-03-15 (×2): 5 [IU] via SUBCUTANEOUS
  Administered 2015-03-15 (×2): 8 [IU] via SUBCUTANEOUS
  Administered 2015-03-16 (×2): 3 [IU] via SUBCUTANEOUS

## 2015-03-13 MED ORDER — ACETAMINOPHEN 325 MG PO TABS: 650.0000 mg | ORAL_TABLET | Freq: Four times a day (QID) | ORAL | Status: DC | PRN

## 2015-03-13 MED ORDER — TRAMADOL HCL 50 MG PO TABS
50.0000 mg | ORAL_TABLET | Freq: Four times a day (QID) | ORAL | Status: DC
  Administered 2015-03-14 – 2015-03-16 (×3): 50 mg via ORAL
  Filled 2015-03-13 (×5): qty 1

## 2015-03-13 MED ORDER — ALBUTEROL SULFATE (2.5 MG/3ML) 0.083% IN NEBU
5.0000 mg | INHALATION_SOLUTION | Freq: Once | RESPIRATORY_TRACT | Status: AC
  Administered 2015-03-13: 5 mg via RESPIRATORY_TRACT
  Filled 2015-03-13: qty 6

## 2015-03-13 MED ORDER — BUDESONIDE-FORMOTEROL FUMARATE 80-4.5 MCG/ACT IN AERO
2.0000 | INHALATION_SPRAY | Freq: Every day | RESPIRATORY_TRACT | Status: DC | PRN
  Administered 2015-03-14: 2 via RESPIRATORY_TRACT
  Filled 2015-03-13: qty 6.9

## 2015-03-13 MED ORDER — ENOXAPARIN SODIUM 40 MG/0.4ML ~~LOC~~ SOLN
40.0000 mg | SUBCUTANEOUS | Status: DC
  Administered 2015-03-13 – 2015-03-15 (×3): 40 mg via SUBCUTANEOUS
  Filled 2015-03-13 (×5): qty 0.4

## 2015-03-13 MED ORDER — CETYLPYRIDINIUM CHLORIDE 0.05 % MT LIQD
7.0000 mL | Freq: Two times a day (BID) | OROMUCOSAL | Status: DC
  Administered 2015-03-13 – 2015-03-16 (×5): 7 mL via OROMUCOSAL

## 2015-03-13 MED ORDER — SODIUM CHLORIDE 0.9 % IJ SOLN
3.0000 mL | Freq: Two times a day (BID) | INTRAMUSCULAR | Status: DC
  Administered 2015-03-15 – 2015-03-16 (×2): 3 mL via INTRAVENOUS

## 2015-03-13 MED ORDER — METHYLPREDNISOLONE SODIUM SUCC 125 MG IJ SOLR
125.0000 mg | Freq: Once | INTRAMUSCULAR | Status: AC
  Administered 2015-03-13: 125 mg via INTRAVENOUS
  Filled 2015-03-13: qty 2

## 2015-03-13 MED ORDER — SODIUM CHLORIDE 0.9 % IV SOLN
INTRAVENOUS | Status: DC
  Administered 2015-03-13: 75 mL/h via INTRAVENOUS
  Administered 2015-03-14 (×2): via INTRAVENOUS

## 2015-03-13 MED ORDER — ALBUTEROL SULFATE (2.5 MG/3ML) 0.083% IN NEBU
INHALATION_SOLUTION | RESPIRATORY_TRACT | Status: AC
  Filled 2015-03-13: qty 6

## 2015-03-13 MED ORDER — INSULIN ASPART 100 UNIT/ML ~~LOC~~ SOLN
7.0000 [IU] | Freq: Once | SUBCUTANEOUS | Status: AC
  Administered 2015-03-13: 7 [IU] via SUBCUTANEOUS

## 2015-03-13 MED ORDER — PRAVASTATIN SODIUM 40 MG PO TABS
40.0000 mg | ORAL_TABLET | Freq: Every day | ORAL | Status: DC
  Administered 2015-03-14 – 2015-03-16 (×3): 40 mg via ORAL
  Filled 2015-03-13 (×4): qty 1

## 2015-03-13 MED ORDER — CEFTRIAXONE SODIUM 1 G IJ SOLR
1.0000 g | INTRAMUSCULAR | Status: DC
  Administered 2015-03-13 – 2015-03-14 (×2): 1 g via INTRAVENOUS
  Filled 2015-03-13 (×3): qty 10

## 2015-03-13 MED ORDER — ESCITALOPRAM OXALATE 20 MG PO TABS
20.0000 mg | ORAL_TABLET | Freq: Every day | ORAL | Status: DC
  Administered 2015-03-14 – 2015-03-16 (×3): 20 mg via ORAL
  Filled 2015-03-13 (×4): qty 1

## 2015-03-13 MED ORDER — DEXTROSE 5 % IV SOLN
500.0000 mg | INTRAVENOUS | Status: DC
  Administered 2015-03-13: 500 mg via INTRAVENOUS
  Filled 2015-03-13 (×2): qty 500

## 2015-03-13 MED ORDER — OXYCODONE HCL 5 MG PO TABS
5.0000 mg | ORAL_TABLET | ORAL | Status: DC | PRN
  Administered 2015-03-15: 5 mg via ORAL
  Filled 2015-03-13: qty 1

## 2015-03-13 MED ORDER — INSULIN ASPART 100 UNIT/ML ~~LOC~~ SOLN
0.0000 [IU] | Freq: Every day | SUBCUTANEOUS | Status: DC
  Administered 2015-03-14: 2 [IU] via SUBCUTANEOUS

## 2015-03-13 MED ORDER — ALUM & MAG HYDROXIDE-SIMETH 200-200-20 MG/5ML PO SUSP: 30.0000 mL | Freq: Four times a day (QID) | ORAL | Status: DC | PRN

## 2015-03-13 MED ORDER — METHYLPREDNISOLONE SODIUM SUCC 125 MG IJ SOLR
60.0000 mg | Freq: Three times a day (TID) | INTRAMUSCULAR | Status: DC
  Administered 2015-03-13 – 2015-03-15 (×5): 60 mg via INTRAVENOUS
  Filled 2015-03-13 (×2): qty 2
  Filled 2015-03-13: qty 0.96
  Filled 2015-03-13: qty 2
  Filled 2015-03-13 (×4): qty 0.96
  Filled 2015-03-13: qty 2
  Filled 2015-03-13: qty 0.96

## 2015-03-13 MED ORDER — IPRATROPIUM-ALBUTEROL 0.5-2.5 (3) MG/3ML IN SOLN
3.0000 mL | Freq: Once | RESPIRATORY_TRACT | Status: AC
  Administered 2015-03-13: 3 mL via RESPIRATORY_TRACT
  Filled 2015-03-13: qty 3

## 2015-03-13 MED ORDER — ONDANSETRON HCL 4 MG/2ML IJ SOLN: 4.0000 mg | Freq: Four times a day (QID) | INTRAMUSCULAR | Status: DC | PRN

## 2015-03-13 MED ORDER — LEVOFLOXACIN 500 MG PO TABS
500.0000 mg | ORAL_TABLET | Freq: Once | ORAL | Status: AC
  Administered 2015-03-13: 500 mg via ORAL
  Filled 2015-03-13: qty 1

## 2015-03-13 MED ORDER — ACETAMINOPHEN 650 MG RE SUPP: 650.0000 mg | Freq: Four times a day (QID) | RECTAL | Status: DC | PRN

## 2015-03-13 MED ORDER — ONDANSETRON HCL 4 MG PO TABS: 4.0000 mg | ORAL_TABLET | Freq: Four times a day (QID) | ORAL | Status: DC | PRN

## 2015-03-13 MED ORDER — ALBUTEROL SULFATE (2.5 MG/3ML) 0.083% IN NEBU
5.0000 mg | INHALATION_SOLUTION | Freq: Once | RESPIRATORY_TRACT | Status: AC
  Administered 2015-03-13: 5 mg via RESPIRATORY_TRACT

## 2015-03-13 MED ORDER — ALBUTEROL SULFATE (2.5 MG/3ML) 0.083% IN NEBU
2.5000 mg | INHALATION_SOLUTION | Freq: Four times a day (QID) | RESPIRATORY_TRACT | Status: DC
  Administered 2015-03-13 – 2015-03-14 (×5): 2.5 mg via RESPIRATORY_TRACT
  Filled 2015-03-13 (×5): qty 3

## 2015-03-13 NOTE — ED Provider Notes (Signed)
CSN: [id number]     Arrival date & time 03/13/15  1345 History   First MD Initiated Contact with Patient 03/13/15 1443     Chief Complaint  Patient presents with  . Cough  . Shortness of Breath     Patient is a 69 y.o. female presenting with cough and shortness of breath. The history is provided by the patient. No language interpreter was used.  Cough Associated symptoms: shortness of breath   Shortness of Breath Associated symptoms: cough    [name] presents for evaluation of SOB and cough.  She reports two days of cough, SOB, productive of green sputum for the last two days.  She denies fevers, chest pain, abdominal pain, vomiting, leg swelling or pain.  Sxs are moderate, constant, worsening.    Past Medical History  Diagnosis Date  . Diabetes mellitus without complication   . Hypertension   . Morbid obesity   . Depression    Past Surgical History  Procedure Laterality Date  . Knee surgery    . Breast surgery     Family History  Problem Relation Age of Onset  . Diabetes Mother   . Diabetes Other   . Cancer Other    Social History  Substance Use Topics  . Smoking status: Never Smoker   . Smokeless tobacco: None  . Alcohol Use: None   OB History    No data available     Review of Systems  Respiratory: Positive for cough and shortness of breath.   All other systems reviewed and are negative.     Allergies  Penicillins  Home Medications   Prior to Admission medications   Medication Sig Start Date End Date Taking? Authorizing Provider  budesonide-formoterol (SYMBICORT) 80-4.5 MCG/ACT inhaler Inhale 2 puffs into the lungs daily as needed (for wheezing or shortness of breath).    Yes Historical Provider, MD  clarithromycin (BIAXIN) 500 MG tablet Take 500 mg by mouth 2 (two) times daily.   Yes Historical Provider, MD  DM-Doxylamine-Acetaminophen (VICKS NYQUIL COLD & FLU) 15-6.25-325 MG CAPS Take 1 capsule by mouth daily as needed (for cold and flu symptoms).    Yes Historical Provider, MD  escitalopram (LEXAPRO) 20 MG tablet Take 20 mg by mouth daily.   Yes Historical Provider, MD  furosemide (LASIX) 20 MG tablet Take 20 mg by mouth daily as needed for fluid.  12/10/14  Yes Historical Provider, MD  halobetasol (ULTRAVATE) 0.05 % cream Apply 1 application topically 2 (two) times daily.    Yes Historical Provider, MD  metFORMIN (GLUCOPHAGE-XR) 500 MG 24 hr tablet Take 500 mg by mouth 2 (two) times daily.   Yes Historical Provider, MD  pravastatin (PRAVACHOL) 40 MG tablet Take 40 mg by mouth daily. 02/03/15  Yes Historical Provider, MD  quinapril-hydrochlorothiazide (ACCURETIC) 20-12.5 MG per tablet Take 1 tablet by mouth daily.   Yes Historical Provider, MD  vitamin B-12 (CYANOCOBALAMIN) 1000 MCG tablet Take 2,000 mcg by mouth daily.   Yes Historical Provider, MD   BP 103/38 mmHg  Pulse 63  Temp(Src) 98.9 F (37.2 C) (Oral)  Resp 30  Ht 5\' 2"  (1.575 m)  Wt 280 lb (127.007 kg)  BMI 51.20 kg/m2  SpO2 93% Physical Exam  Constitutional: She is oriented to person, place, and time. She appears well-developed and well-nourished.  HENT:  Head: Normocephalic and atraumatic.  Cardiovascular: Normal rate and regular rhythm.   No murmur heard. Pulmonary/Chest: Effort normal.  Diffuse wheezing in all lung fields.  Abdominal: Soft. There is no tenderness. There is no rebound and no guarding.  Musculoskeletal: She exhibits no edema or tenderness.  Neurological: She is alert and oriented to person, place, and time.  Skin: Skin is warm and dry.  Psychiatric: She has a normal mood and affect. Her behavior is normal.  Nursing note and vitals reviewed.   ED Course  Procedures (including critical care time) Labs Review Labs Reviewed  CBC WITH DIFFERENTIAL/PLATELET  BASIC METABOLIC PANEL  D-DIMER, QUANTITATIVE (NOT AT [facility])  [place], ED    Imaging Review Dg Chest 2 View  03/14/2015   CLINICAL DATA:  Pneumonia.   EXAM: CHEST  2 VIEW  COMPARISON:  03/13/2015.  FINDINGS: Mediastinum hilar structures are normal. Cardiomegaly with normal pulmonary vascularity. Low lung volumes with mild bibasilar atelectasis. Partial clearing of right lower lobe infiltrate. No pleural effusion or pneumothorax. Degenerative changes thoracic spine with mild scoliosis.  IMPRESSION: 1. Partial clearing of right lower lobe infiltrate. 2. Low lung volumes with mild bibasilar atelectasis. 3. Cardiomegaly.  No pulmonary venous congestion.   Electronically Signed   By: Marcello Moores  Register   On: 03/14/2015 07:36   Dg Chest Port 1 View  03/13/2015   CLINICAL DATA:  68 year old female with severe shortness of breath and a history of frequent bronchitis.  EXAM: PORTABLE CHEST - 1 VIEW  COMPARISON:  Prior chest x-ray 03/29/2011  FINDINGS: Cardiac and mediastinal contours are within normal limits. Patchy right basilar opacity may represent infiltrate or atelectasis. No pneumothorax, pulmonary edema or large pleural effusion. No suspicious pulmonary mass or nodule. No acute osseous abnormality.  IMPRESSION: Patchy right basilar opacity may reflect atelectasis or infiltrate. Consider dedicated PA and lateral chest x-ray.   Electronically Signed   By: Jacqulynn Cadet M.D.   On: 03/13/2015 15:08   I have personally reviewed and evaluated these images and lab results as part of my medical decision-making.   EKG Interpretation   Date/Time:  Monday March 13 2015 13:50:47 EDT Ventricular Rate:  102 PR Interval:  140 QRS Duration: 76 QT Interval:  334 QTC Calculation: 435 R Axis:   82 Text Interpretation:  Sinus tachycardia Nonspecific ST abnormality  Abnormal ECG Confirmed by Hazle Coca [phone]) on 03/13/2015 2:48:18 PM      MDM   Final diagnoses:  CAP (community acquired pneumonia)    Patient here with increased shortness of breath, cough, productive sputum. She has diffuse wheezing on examination. Provided multiple laser treatments in the  emergency department following a minimal improvement in her symptoms. Chest x-ray is consistent with pneumonia. Given persistent shortness of breath and wheezing discussed with hospitalist regarding admission for antibiotics and continued treatments    Quintella Reichert, MD 03/14/15 [phone]

## 2015-03-13 NOTE — ED Notes (Signed)
Pt from home for eval of productive cough x2 days with green sputum, pt states cough continued to get worse and now sob. Pt states cp from cough, denies any n/v/d at this time. Pt denies any new swelling to arm and legs. Pt able to speak in complete sentences, sob with exertion noted.

## 2015-03-13 NOTE — H&P (Signed)
[facility] History and Physical  LORALIE MALTA FIE:[id number] DOB: 11-01-1945 DOA: 03/13/2015  Referring physician:  PCP: Merrilee Seashore, MD   Chief Complaint: Shortness of breath/cough  HPI: DIAN LAPRADE is a 69 y.o. female with a past medical history of type 2 diabetes mellitus, morbid obesity, presenting to the emergent department with complaints of cough/shortness of breath. She reports symptoms started last Friday progressively worsening over the weekend, reporting shortness of breath, cough associate with green sputum production, subjective fevers, chills, malaise, myalgias, congestion and wheezing. She denies recent travel or sick contacts. She was found to be in respiratory distress in the emergency room, improved with albuterol nebulizers. Workup in the emergency department included a chest x-ray that revealed patchy right basilar opacity consistent with community acquire pneumonia.                                             Review of Systems:  Constitutional:  No weight loss, night sweats, positive for fevers, chills, fatigue.  HEENT:  No headaches, Difficulty swallowing,Tooth/dental problems,Sore throat,  No sneezing, itching, ear ache, nasal congestion, post nasal drip,  Cardio-vascular:  No chest pain, Orthopnea, PND, swelling in lower extremities, anasarca, dizziness, palpitations  GI:  No heartburn, indigestion, abdominal pain, nausea, vomiting, diarrhea, change in bowel habits, loss of appetite  Resp:  Positive for shortness of breath with exertion or at rest. No excess mucus, no productive cough, No non-productive cough, No coughing up of blood.No change in color of mucus. Positive for wheezing.No chest wall deformity  Skin:  no rash or lesions.  GU:  no dysuria, change in color of urine, no urgency or frequency. No flank pain.  Musculoskeletal:  No joint pain or swelling. No decreased range of motion. No back pain.  Psych:  No change in mood or  affect. No depression or anxiety. No memory loss.   Past Medical History  Diagnosis Date  . Diabetes mellitus without complication   . Hypertension   . Morbid obesity   . Depression    Past Surgical History  Procedure Laterality Date  . Knee surgery    . Breast surgery     Social History:  reports that she has never smoked. She does not have any smokeless tobacco history on file. Her alcohol and drug histories are not on file.  Allergies  Allergen Reactions  . Penicillins Other (See Comments)    Faint, passes out    Family History  Problem Relation Age of Onset  . Diabetes Mother   . Diabetes Other   . Cancer Other      Prior to Admission medications   Medication Sig Start Date End Date Taking? Authorizing Provider  budesonide-formoterol (SYMBICORT) 80-4.5 MCG/ACT inhaler Inhale 2 puffs into the lungs daily as needed (for wheezing or shortness of breath).    Yes Historical Provider, MD  clarithromycin (BIAXIN) 500 MG tablet Take 500 mg by mouth 2 (two) times daily.   Yes Historical Provider, MD  DM-Doxylamine-Acetaminophen (VICKS NYQUIL COLD & FLU) 15-6.25-325 MG CAPS Take 1 capsule by mouth daily as needed (for cold and flu symptoms).   Yes Historical Provider, MD  escitalopram (LEXAPRO) 20 MG tablet Take 20 mg by mouth daily.   Yes Historical Provider, MD  furosemide (LASIX) 20 MG tablet Take 20 mg by mouth daily as needed for fluid.  12/10/14  Yes Historical Provider,  MD  halobetasol (ULTRAVATE) 0.05 % cream Apply 1 application topically 2 (two) times daily.    Yes Historical Provider, MD  metFORMIN (GLUCOPHAGE-XR) 500 MG 24 hr tablet Take 500 mg by mouth 2 (two) times daily.   Yes Historical Provider, MD  pravastatin (PRAVACHOL) 40 MG tablet Take 40 mg by mouth daily. 02/03/15  Yes Historical Provider, MD  quinapril-hydrochlorothiazide (ACCURETIC) 20-12.5 MG per tablet Take 1 tablet by mouth daily.   Yes Historical Provider, MD  vitamin B-12 (CYANOCOBALAMIN) 1000 MCG tablet  Take 2,000 mcg by mouth daily.   Yes Historical Provider, MD   Physical Exam: Filed Vitals:   03/13/15 1427 03/13/15 1500 03/13/15 1530 03/13/15 1600  BP: 103/38 117/52 122/60   Pulse: 63 100 97 96  Temp:      TempSrc:      Resp: 30 10 20 22   Height:      Weight:      SpO2: 93% 100% 93% 95%    Wt Readings from Last 3 Encounters:  03/13/15 127.007 kg (280 lb)  11/04/12 127.234 kg (280 lb 8 oz)  09/17/12 127.778 kg (281 lb 11.2 oz)    General:  Patient appears short of breath at rest, having audible wheezes, mentating well otherwise, awake alert oriented 3 Eyes: PERRL, normal lids, irises & conjunctiva ENT: grossly normal hearing, lips & tongue Neck: no LAD, masses or thyromegaly Cardiovascular: Tachycardic, RRR, no m/r/g. No LE edema. Telemetry: SR, no arrhythmias  Respiratory: Coarse respiratory sounds having extensive bilateral expiratory wheezes with prolonged expiratory phase, bilateral rhonchi present as well. Having some labored breathing Abdomen: soft, ntnd Skin: no rash or induration seen on limited exam Musculoskeletal: grossly normal tone BUE/BLE Psychiatric: grossly normal mood and affect, speech fluent and appropriate Neurologic: grossly non-focal.          Labs on Admission:  Basic Metabolic Panel:  Recent Labs Lab 03/13/15 1440  NA 138  K 3.8  CL 101  CO2 26  GLUCOSE 140*  BUN 18  CREATININE 0.91  CALCIUM 9.4   Liver Function Tests: No results for input(s): AST, ALT, ALKPHOS, BILITOT, PROT, ALBUMIN in the last 168 hours. No results for input(s): LIPASE, AMYLASE in the last 168 hours. No results for input(s): AMMONIA in the last 168 hours. CBC:  Recent Labs Lab 03/13/15 1440  WBC 8.2  NEUTROABS 5.2  HGB 12.7  HCT 40.3  MCV 93.3  PLT 183   Cardiac Enzymes: No results for input(s): CKTOTAL, CKMB, CKMBINDEX, TROPONINI in the last 168 hours.  BNP (last 3 results)  Recent Labs  03/13/15 1440  BNP 17.7    ProBNP (last 3 results) No  results for input(s): PROBNP in the last 8760 hours.  CBG: No results for input(s): GLUCAP in the last 168 hours.  Radiological Exams on Admission: Dg Chest Port 1 View  03/13/2015   CLINICAL DATA:  69 year old female with severe shortness of breath and a history of frequent bronchitis.  EXAM: PORTABLE CHEST - 1 VIEW  COMPARISON:  Prior chest x-ray 03/29/2011  FINDINGS: Cardiac and mediastinal contours are within normal limits. Patchy right basilar opacity may represent infiltrate or atelectasis. No pneumothorax, pulmonary edema or large pleural effusion. No suspicious pulmonary mass or nodule. No acute osseous abnormality.  IMPRESSION: Patchy right basilar opacity may reflect atelectasis or infiltrate. Consider dedicated PA and lateral chest x-ray.   Electronically Signed   By: Jacqulynn Cadet M.D.   On: 03/13/2015 15:08    EKG: Independently reviewed.  Assessment/Plan Principal Problem:   CAP (community acquired pneumonia) Active Problems:   Diabetes mellitus   Respiratory failure, acute   Reactive airway disease   1. Community acquire pneumonia. Mrs. Bobbye Charleston is a pleasant 69 year old presenting with clinical signs symptoms consistent with pneumonia. A chest x-ray in the emergency department showed past she right basilar opacity. Will treat for community acquired pneumonia with Ceftriaxone 1 g IV every 24 hours and Azithromycin 500 mg IV every 24 hours. Plan to repeat chest x-ray in a.m. 2. Acute respiratory failure, evidence by a respiratory rate of 30, presenting in respiratory distress. Likely secondary to community acquired pneumonia as well as reactive airways disease given extensive wheezing on exam. She improved with nebulizer treatment in the emergency room. Will start empiric IV antibiotic therapy, IV steroids, albuterol nebs 4 times a day. 3. Suspected reactive airway disease. She denies history of COPD or asthma. Has never smoked. On physical examination had extensive bilateral  exotropia wheezes. Will treat with Solu-Medrol were milligrams IV every 8 hours and albuterol nebulizer treatments.  4. Type 2 diabetes mellitus. Will discontinue metformin therapy and place her on sliding scale with Accu-Cheks before meals and at bedtime. Monitor blood sugars closely as she is being started on IV steroids.  5. Dyslipidemia. Continue statin therapy  6. DVT prophylaxis. Lovenox     Code Status: Full code Family Communication: I spoke to family members present at bedside Disposition Plan: Will admit patient to the inpatient service, despite will require greater than 2 nights hospitalization  Time spent: 65 minutes  Kelvin Cellar [facility] Pager [phone]

## 2015-03-13 NOTE — ED Notes (Signed)
Patient brought to room via wheelchair; patient getting undressed and into a gown at this time; Abigail Butts, RN aware

## 2015-03-14 ENCOUNTER — Inpatient Hospital Stay (HOSPITAL_COMMUNITY): Payer: BC Managed Care – PPO

## 2015-03-14 DIAGNOSIS — J4530 Mild persistent asthma, uncomplicated: Secondary | ICD-10-CM

## 2015-03-14 LAB — GLUCOSE, CAPILLARY
GLUCOSE-CAPILLARY: 224 mg/dL — AB (ref 65–99)
Glucose-Capillary: 251 mg/dL — ABNORMAL HIGH (ref 65–99)
Glucose-Capillary: 267 mg/dL — ABNORMAL HIGH (ref 65–99)
Glucose-Capillary: 250 mg/dL — ABNORMAL HIGH (ref 65–99)

## 2015-03-14 MED ORDER — LIVING WELL WITH DIABETES BOOK
Freq: Once | Status: AC
  Administered 2015-03-14: 05:00:00
  Filled 2015-03-14: qty 1

## 2015-03-14 MED ORDER — INFLUENZA VAC SPLIT QUAD 0.5 ML IM SUSY
0.5000 mL | PREFILLED_SYRINGE | INTRAMUSCULAR | Status: AC
  Administered 2015-03-16: 0.5 mL via INTRAMUSCULAR
  Filled 2015-03-14: qty 0.5

## 2015-03-14 MED ORDER — ALBUTEROL SULFATE (2.5 MG/3ML) 0.083% IN NEBU
2.5000 mg | INHALATION_SOLUTION | Freq: Two times a day (BID) | RESPIRATORY_TRACT | Status: DC
  Administered 2015-03-14: 2.5 mg via RESPIRATORY_TRACT
  Filled 2015-03-14: qty 3

## 2015-03-14 MED ORDER — ALBUTEROL SULFATE (2.5 MG/3ML) 0.083% IN NEBU
2.5000 mg | INHALATION_SOLUTION | RESPIRATORY_TRACT | Status: DC | PRN
  Administered 2015-03-15 (×2): 2.5 mg via RESPIRATORY_TRACT
  Filled 2015-03-14 (×2): qty 3

## 2015-03-14 MED ORDER — AZITHROMYCIN 500 MG PO TABS
500.0000 mg | ORAL_TABLET | Freq: Every day | ORAL | Status: DC
  Administered 2015-03-14: 500 mg via ORAL
  Filled 2015-03-14 (×2): qty 1

## 2015-03-14 MED ORDER — ALBUTEROL SULFATE (2.5 MG/3ML) 0.083% IN NEBU: 2.5000 mg | INHALATION_SOLUTION | Freq: Two times a day (BID) | RESPIRATORY_TRACT | Status: DC

## 2015-03-14 NOTE — Progress Notes (Signed)
[facility] PROGRESS NOTE Assessment/Plan: Acute respiratory failure with hypoxia due to CAP and reactive airway disease: Chest x-ray in the emergency room department show right basilar opacity, he was started empirically on IV Rocephin and azithromycin, as well on his IV steroids and nebulizers. Continue oxygen.    Uncontrolled Diabetes mellitus: Continue metformin plus sliding scale insulin. Check A1c   Code Status: full Family Communication: none  Disposition Plan: home in 2-3 days   Consultants:  none  Procedures:  CXR  Antibiotics:  Rocephin and azithro  HPI/Subjective: No complains  Objective: Filed Vitals:   03/13/15 1945 03/13/15 1955 03/14/15 0540 03/14/15 0805  BP: 137/47  132/61   Pulse: 102  80   Temp: 98 F (36.7 C)  97.4 F (36.3 C)   TempSrc: Oral  Oral   Resp: 20  20   Height:      Weight:      SpO2: 96% 96% 96% 95%    Intake/Output Summary (Last 24 hours) at 03/14/15 0827 Last data filed at 03/14/15 0700  Gross per 24 hour  Intake 1766.25 ml  Output   1000 ml  Net 766.25 ml   Filed Weights   03/13/15 1349  Weight: 127.007 kg (280 lb)    Exam:  General: Alert, awake, oriented x3, in no acute distress.  HEENT: No bruits, no goiter.  Heart: Regular rate and rhythm. Lungs: Good air movement, crackles on the right with wheezing bilaterally. Abdomen: Soft, nontender, nondistended, positive bowel sounds.  Neuro: Grossly intact, nonfocal.   Data Reviewed: Basic Metabolic Panel:  Recent Labs Lab 03/13/15 1440  NA 138  K 3.8  CL 101  CO2 26  GLUCOSE 140*  BUN 18  CREATININE 0.91  CALCIUM 9.4   Liver Function Tests: No results for input(s): AST, ALT, ALKPHOS, BILITOT, PROT, ALBUMIN in the last 168 hours. No results for input(s): LIPASE, AMYLASE in the last 168 hours. No results for input(s): AMMONIA in the last 168 hours. CBC:  Recent Labs Lab 03/13/15 1440  WBC 8.2  NEUTROABS 5.2  HGB 12.7  HCT 40.3    MCV 93.3  PLT 183   Cardiac Enzymes: No results for input(s): CKTOTAL, CKMB, CKMBINDEX, TROPONINI in the last 168 hours. BNP (last 3 results)  Recent Labs  03/13/15 1440  BNP 17.7    ProBNP (last 3 results) No results for input(s): PROBNP in the last 8760 hours.  CBG:  Recent Labs Lab 03/13/15 2125 03/14/15 0544  GLUCAP 409* 224*    No results found for this or any previous visit (from the past 240 hour(s)).   Studies: Dg Chest 2 View  03/14/2015   CLINICAL DATA:  Pneumonia.  EXAM: CHEST  2 VIEW  COMPARISON:  03/13/2015.  FINDINGS: Mediastinum hilar structures are normal. Cardiomegaly with normal pulmonary vascularity. Low lung volumes with mild bibasilar atelectasis. Partial clearing of right lower lobe infiltrate. No pleural effusion or pneumothorax. Degenerative changes thoracic spine with mild scoliosis.  IMPRESSION: 1. Partial clearing of right lower lobe infiltrate. 2. Low lung volumes with mild bibasilar atelectasis. 3. Cardiomegaly.  No pulmonary venous congestion.   Electronically Signed   By: Marcello Moores  Register   On: 03/14/2015 07:36   Dg Chest Port 1 View  03/13/2015   CLINICAL DATA:  69 year old female with severe shortness of breath and a history of frequent bronchitis.  EXAM: PORTABLE CHEST - 1 VIEW  COMPARISON:  Prior chest x-ray 03/29/2011  FINDINGS: Cardiac and mediastinal contours are within normal limits.  Patchy right basilar opacity may represent infiltrate or atelectasis. No pneumothorax, pulmonary edema or large pleural effusion. No suspicious pulmonary mass or nodule. No acute osseous abnormality.  IMPRESSION: Patchy right basilar opacity may reflect atelectasis or infiltrate. Consider dedicated PA and lateral chest x-ray.   Electronically Signed   By: Jacqulynn Cadet M.D.   On: 03/13/2015 15:08    Scheduled Meds: . albuterol  2.5 mg Nebulization QID  . antiseptic oral rinse  7 mL Mouth Rinse BID  . azithromycin  500 mg Intravenous Q24H  . cefTRIAXone  (ROCEPHIN)  IV  1 g Intravenous Q24H  . enoxaparin (LOVENOX) injection  40 mg Subcutaneous Q24H  . escitalopram  20 mg Oral Daily  . [START ON 03/15/2015] Influenza vac split quadrivalent PF  0.5 mL Intramuscular Tomorrow-1000  . insulin aspart  0-15 Units Subcutaneous TID WC  . insulin aspart  0-5 Units Subcutaneous QHS  . living well with diabetes book   Does not apply Once  . methylPREDNISolone (SOLU-MEDROL) injection  60 mg Intravenous Q8H  . pravastatin  40 mg Oral Daily  . sodium chloride  3 mL Intravenous Q12H  . traMADol  50 mg Oral 4 times per day   Continuous Infusions: . sodium chloride 75 mL/hr (03/13/15 1751)    Time Spent: 25 MIN   FELIZ Marguarite Arbour  [facility] Pager [phone]. If 7PM-7AM, please contact night-coverage at [email or web address], password [facility] 03/14/2015, 8:27 AM  LOS: 1 day

## 2015-03-14 NOTE — Plan of Care (Signed)
Problem: Phase I Progression Outcomes Goal: Dyspnea controlled at rest Outcome: Progressing Patient had expiratory wheezing at rest when lying down which improved with Symbicort inhaler.  She is SOB with ambulation to bathroom and back to bed.  O2sat on RA 95% after ambulation.  Will continue to monitor.

## 2015-03-14 NOTE — Progress Notes (Signed)
Inpatient Diabetes Program Recommendations  AACE/ADA: New Consensus Statement on Inpatient Glycemic Control (2013)  Target Ranges:  Prepandial:   less than 140 mg/dL      Peak postprandial:   less than 180 mg/dL (1-2 hours)      Critically ill patients:  140 - 180 mg/dL   Results for JO-ANN, JOHANNING (MRN [id number]) as of 03/14/2015 11:23  Ref. Range 03/13/2015 21:25 03/14/2015 05:44 03/14/2015 11:16  Glucose-Capillary Latest Ref Range: 65-99 mg/dL 409 (H) 224 (H) 251 (H)    Diabetes history: DM2 Outpatient Diabetes medications: Metfomrin 500 mg BID Current orders for Inpatient glycemic control: Novolog 0-15 units TID with meals, Novolog 0-5 units HS  Inpatient Diabetes Program Recommendations Insulin - Basal: Patient is currently ordered Solumedrol 60 mg Q8H which is contributing to hyperglycemia. If steroids are continued as ordered, please consider ordering low dose basal insulin. Insulin - Meal Coverage: If steroids are continued as ordered, please consider ordering Novolog 5 units TID with meals for meal coverage (in addition to Novolog correction scale).  Thanks, Barnie Alderman, RN, MSN, CCRN, CDE Diabetes Coordinator Inpatient Diabetes Program [phone] (Team Pager from [place] to [place]) [phone] ([facility] office) [phone] [facility] office) [phone] [facility] office)

## 2015-03-15 DIAGNOSIS — E785 Hyperlipidemia, unspecified: Secondary | ICD-10-CM

## 2015-03-15 DIAGNOSIS — J4520 Mild intermittent asthma, uncomplicated: Secondary | ICD-10-CM

## 2015-03-15 DIAGNOSIS — I10 Essential (primary) hypertension: Secondary | ICD-10-CM

## 2015-03-15 DIAGNOSIS — J9601 Acute respiratory failure with hypoxia: Secondary | ICD-10-CM

## 2015-03-15 DIAGNOSIS — E1165 Type 2 diabetes mellitus with hyperglycemia: Secondary | ICD-10-CM

## 2015-03-15 DIAGNOSIS — J189 Pneumonia, unspecified organism: Principal | ICD-10-CM

## 2015-03-15 LAB — GLUCOSE, CAPILLARY
GLUCOSE-CAPILLARY: 264 mg/dL — AB (ref 65–99)
GLUCOSE-CAPILLARY: 299 mg/dL — AB (ref 65–99)
Glucose-Capillary: 223 mg/dL — ABNORMAL HIGH (ref 65–99)
Glucose-Capillary: 196 mg/dL — ABNORMAL HIGH (ref 65–99)

## 2015-03-15 LAB — HEMOGLOBIN A1C
Hgb A1c MFr Bld: 7 % — ABNORMAL HIGH (ref 4.8–5.6)
Mean Plasma Glucose: 154 mg/dL

## 2015-03-15 MED ORDER — BUDESONIDE 0.25 MG/2ML IN SUSP
0.2500 mg | Freq: Two times a day (BID) | RESPIRATORY_TRACT | Status: DC
  Administered 2015-03-15 – 2015-03-16 (×3): 0.25 mg via RESPIRATORY_TRACT
  Filled 2015-03-15 (×4): qty 2

## 2015-03-15 MED ORDER — LISINOPRIL 20 MG PO TABS
20.0000 mg | ORAL_TABLET | Freq: Every day | ORAL | Status: DC
  Administered 2015-03-15 – 2015-03-16 (×2): 20 mg via ORAL
  Filled 2015-03-15: qty 2
  Filled 2015-03-15 (×2): qty 1

## 2015-03-15 MED ORDER — QUINAPRIL-HYDROCHLOROTHIAZIDE 20-12.5 MG PO TABS: 1.0000 | ORAL_TABLET | Freq: Every day | ORAL | Status: DC

## 2015-03-15 MED ORDER — LEVOFLOXACIN 750 MG PO TABS
750.0000 mg | ORAL_TABLET | Freq: Every day | ORAL | Status: DC
  Administered 2015-03-15 – 2015-03-16 (×2): 750 mg via ORAL
  Filled 2015-03-15 (×3): qty 1

## 2015-03-15 MED ORDER — HYDROCHLOROTHIAZIDE 12.5 MG PO CAPS
12.5000 mg | ORAL_CAPSULE | Freq: Every day | ORAL | Status: DC
  Administered 2015-03-15 – 2015-03-16 (×2): 12.5 mg via ORAL
  Filled 2015-03-15 (×3): qty 1

## 2015-03-15 MED ORDER — HYDROCOD POLST-CPM POLST ER 10-8 MG/5ML PO SUER
5.0000 mL | Freq: Two times a day (BID) | ORAL | Status: DC | PRN
  Administered 2015-03-15: 5 mL via ORAL
  Filled 2015-03-15: qty 5

## 2015-03-15 MED ORDER — IPRATROPIUM-ALBUTEROL 0.5-2.5 (3) MG/3ML IN SOLN
3.0000 mL | Freq: Four times a day (QID) | RESPIRATORY_TRACT | Status: DC
  Administered 2015-03-15 – 2015-03-16 (×5): 3 mL via RESPIRATORY_TRACT
  Filled 2015-03-15 (×6): qty 3

## 2015-03-15 MED ORDER — PANTOPRAZOLE SODIUM 40 MG PO TBEC
40.0000 mg | DELAYED_RELEASE_TABLET | Freq: Every day | ORAL | Status: DC
  Administered 2015-03-15 – 2015-03-16 (×2): 40 mg via ORAL
  Filled 2015-03-15: qty 1

## 2015-03-15 MED ORDER — HYDROCOD POLST-CPM POLST ER 10-8 MG/5ML PO SUER
5.0000 mL | Freq: Once | ORAL | Status: AC
  Administered 2015-03-15: 5 mL via ORAL
  Filled 2015-03-15: qty 5

## 2015-03-15 MED ORDER — PREDNISONE 20 MG PO TABS
40.0000 mg | ORAL_TABLET | Freq: Two times a day (BID) | ORAL | Status: DC
  Administered 2015-03-15 – 2015-03-16 (×2): 40 mg via ORAL
  Filled 2015-03-15 (×4): qty 2

## 2015-03-15 NOTE — Plan of Care (Signed)
Problem: Phase II Progression Outcomes Goal: Wean O2 if indicated Outcome: Progressing Patient's was off O2 for a few hours today.  However, she becomes extremely dyspneic after ambulating to the bathroom on room air although her oxygen saturation remains >95%.  Patient placed on 1.5L/min of O2 and encourage to increase activity gradually.  Patient agreed to use the [facility] for toileting until activity tolerance improves.

## 2015-03-15 NOTE — Progress Notes (Signed)
[facility] PROGRESS NOTE Assessment/Plan: Acute respiratory failure with hypoxia due to RLL CAP and reactive airway disease: -Chest x-ray in the emergency room department show right basilar opacity -improving and breathing better -no fever and WBC's WNL -will switch abx's to PO and wean O2 supplementation and steroids -will start flutter valve and pulmicort -will follow response -patient with OHS/OSA contributing to her SOB as well -continue PRN Tussionex   Morbid obesity  Body mass index is 51.2 kg/(m^2). -low calorie diet and exercise discussed with patient  Hx of OSA -not on CPAP or BIPAP yet -will need repeat study as an outpatient and pulmonologist/PCP advocating for need of machine if studies revealing that (insurance is decline coverage according to patient)  Hx of depression -will continue lexapro. -no SI and no hallucinations  Diabetes mellitus type with hyperglycemia: -due to steroids -A1C 7.0 -Continue SSI -anticipating improvement in CBG's with steroids tapering   HLD: will continue statins  HTN: will resume home antihypertensive regimen  GERD/GI protection: will start PPI  Code Status: full Family Communication: none  Disposition Plan: home in 2-3 days   Consultants:  none  Procedures:  CXR  Antibiotics:  Rocephin and azithro  HPI/Subjective: No complains  Objective: Filed Vitals:   03/15/15 0420 03/15/15 0632 03/15/15 0726 03/15/15 1203  BP: 141/85     Pulse: 76 82    Temp: 97.6 F (36.4 C)     TempSrc: Oral     Resp: 17 16    Height:      Weight:      SpO2: 96% 98% 96% 96%    Intake/Output Summary (Last 24 hours) at 03/15/15 1232 Last data filed at 03/15/15 1000  Gross per 24 hour  Intake 2122.5 ml  Output    625 ml  Net 1497.5 ml   Filed Weights   03/13/15 1349  Weight: 127.007 kg (280 lb)    Exam:  General: Alert, awake, oriented x3, in no acute distress.  HEENT: No bruits, no goiter.  Heart: Regular  rate and rhythm. Lungs: Good air movement, crackles on the right with wheezing bilaterally. Abdomen: Soft, nontender, nondistended, positive bowel sounds.  Neuro: Grossly intact, nonfocal.   Data Reviewed: Basic Metabolic Panel:  Recent Labs Lab 03/13/15 1440  NA 138  K 3.8  CL 101  CO2 26  GLUCOSE 140*  BUN 18  CREATININE 0.91  CALCIUM 9.4   Liver Function Tests: No results for input(s): AST, ALT, ALKPHOS, BILITOT, PROT, ALBUMIN in the last 168 hours. No results for input(s): LIPASE, AMYLASE in the last 168 hours. No results for input(s): AMMONIA in the last 168 hours. CBC:  Recent Labs Lab 03/13/15 1440  WBC 8.2  NEUTROABS 5.2  HGB 12.7  HCT 40.3  MCV 93.3  PLT 183   Cardiac Enzymes: No results for input(s): CKTOTAL, CKMB, CKMBINDEX, TROPONINI in the last 168 hours. BNP (last 3 results)  Recent Labs  03/13/15 1440  BNP 17.7    ProBNP (last 3 results) No results for input(s): PROBNP in the last 8760 hours.  CBG:  Recent Labs Lab 03/14/15 1116 03/14/15 1553 03/14/15 2119 03/15/15 0608 03/15/15 1121  GLUCAP 251* 267* 250* 223* 264*    No results found for this or any previous visit (from the past 240 hour(s)).   Studies: Dg Chest 2 View  03/14/2015   CLINICAL DATA:  Pneumonia.  EXAM: CHEST  2 VIEW  COMPARISON:  03/13/2015.  FINDINGS: Mediastinum hilar structures are normal. Cardiomegaly with normal  pulmonary vascularity. Low lung volumes with mild bibasilar atelectasis. Partial clearing of right lower lobe infiltrate. No pleural effusion or pneumothorax. Degenerative changes thoracic spine with mild scoliosis.  IMPRESSION: 1. Partial clearing of right lower lobe infiltrate. 2. Low lung volumes with mild bibasilar atelectasis. 3. Cardiomegaly.  No pulmonary venous congestion.   Electronically Signed   By: Marcello Moores  Register   On: 03/14/2015 07:36   Dg Chest Port 1 View  03/13/2015   CLINICAL DATA:  69 year old female with severe shortness of breath and a  history of frequent bronchitis.  EXAM: PORTABLE CHEST - 1 VIEW  COMPARISON:  Prior chest x-ray 03/29/2011  FINDINGS: Cardiac and mediastinal contours are within normal limits. Patchy right basilar opacity may represent infiltrate or atelectasis. No pneumothorax, pulmonary edema or large pleural effusion. No suspicious pulmonary mass or nodule. No acute osseous abnormality.  IMPRESSION: Patchy right basilar opacity may reflect atelectasis or infiltrate. Consider dedicated PA and lateral chest x-ray.   Electronically Signed   By: Jacqulynn Cadet M.D.   On: 03/13/2015 15:08    Scheduled Meds: . antiseptic oral rinse  7 mL Mouth Rinse BID  . budesonide (PULMICORT) nebulizer solution  0.25 mg Nebulization BID  . enoxaparin (LOVENOX) injection  40 mg Subcutaneous Q24H  . escitalopram  20 mg Oral Daily  . Influenza vac split quadrivalent PF  0.5 mL Intramuscular Tomorrow-1000  . insulin aspart  0-15 Units Subcutaneous TID WC  . insulin aspart  0-5 Units Subcutaneous QHS  . ipratropium-albuterol  3 mL Nebulization QID  . levofloxacin  750 mg Oral Daily  . pravastatin  40 mg Oral Daily  . predniSONE  40 mg Oral BID WC  . sodium chloride  3 mL Intravenous Q12H  . traMADol  50 mg Oral 4 times per day   Continuous Infusions:    Time Spent: 25 MIN   Barton Dubois  [facility] Pager [phone]. If 7PM-7AM, please contact night-coverage at [email or web address], password [facility] 03/15/2015, 12:32 PM  LOS: 2 days

## 2015-03-16 DIAGNOSIS — E119 Type 2 diabetes mellitus without complications: Secondary | ICD-10-CM

## 2015-03-16 DIAGNOSIS — K219 Gastro-esophageal reflux disease without esophagitis: Secondary | ICD-10-CM

## 2015-03-16 DIAGNOSIS — E785 Hyperlipidemia, unspecified: Secondary | ICD-10-CM | POA: Insufficient documentation

## 2015-03-16 LAB — GLUCOSE, CAPILLARY
GLUCOSE-CAPILLARY: 181 mg/dL — AB (ref 65–99)
GLUCOSE-CAPILLARY: 178 mg/dL — AB (ref 65–99)

## 2015-03-16 MED ORDER — LEVOFLOXACIN 750 MG PO TABS: 750.0000 mg | ORAL_TABLET | Freq: Every day | ORAL | Status: DC

## 2015-03-16 MED ORDER — IPRATROPIUM-ALBUTEROL 20-100 MCG/ACT IN AERS: 1.0000 | INHALATION_SPRAY | Freq: Four times a day (QID) | RESPIRATORY_TRACT | Status: DC | PRN

## 2015-03-16 MED ORDER — PREDNISONE 20 MG PO TABS: ORAL_TABLET | ORAL | Status: DC

## 2015-03-16 MED ORDER — PANTOPRAZOLE SODIUM 40 MG PO TBEC: 40.0000 mg | DELAYED_RELEASE_TABLET | Freq: Every day | ORAL | Status: DC

## 2015-03-16 MED ORDER — HYDROCOD POLST-CPM POLST ER 10-8 MG/5ML PO SUER: 5.0000 mL | Freq: Two times a day (BID) | ORAL | Status: DC | PRN

## 2015-03-16 NOTE — Discharge Summary (Signed)
Physician Discharge Summary  TERENCE GOOGE NGE:[id number] DOB: 1946/03/19 DOA: 03/13/2015  PCP: Merrilee Seashore, MD  Admit date: 03/13/2015 Discharge date: 03/16/2015  Time spent: >30 minutes  Recommendations for Outpatient Follow-up:  Follow response to treatment to her PNA/SOB CXR in 3 weeks to follow resolution of infiltrates Reassess BP and adjust medications as needed Patient will need another sleep study and assistance getting either CPAP or BIPAP  Discharge Diagnoses:  Respiratory failure with hypoxia, acute CAP (community acquired pneumonia) Reactive airway disease Diabetes mellitus type 2 (A1C 7.0) Morbid Obesity HTN HLD GERD OSA/OHS Depression   Discharge Condition: stable and improved. Will discharge to home with outpatient follow up with PCP on 03/21/15  Diet recommendation: heart healthy, low calorie and low carbohydrates diet   Filed Weights   03/13/15 1349  Weight: 127.007 kg (280 lb)    History of present illness:  69 y.o. female with a past medical history of type 2 diabetes mellitus, morbid obesity, presenting to the emergent department with complaints of cough/shortness of breath. She reports symptoms started on 03/10/15 and has been progressively worsening over the last 2-3 days PTA. She is reporting shortness of breath, productive cough (associated with green sputum production, subjective fevers, chills, malaise, myalgias, congestion and wheezing). She denies recent travel or sick contacts. She was found to be in respiratory distress in the emergency room; she had improvement with albuterol nebulizers. Workup in the emergency department included a chest x-ray that revealed patchy right basilar opacity consistent with community acquire pneumonia. No prior hx of COPD or Asthma.  Hospital Course:  Acute respiratory failure with hypoxia due to RLL CAP and reactive airway disease: -Chest x-ray in the emergency room department show right basilar opacity -no  fever, WBC's WNL and breathing better -O2 sat 94-95% on RA -will switch abx's to PO and discharge with tapering steroids and PRN combivent -will encourage use of flutter valve -patient with OHS/OSA contributing to her SOB as well -continue PRN Tussionex   Morbid obesity  Body mass index is 51.2 kg/(m^2). -low calorie diet and exercise discussed with patient  Hx of OSA -not on CPAP or BIPAP yet -will need repeat study as an outpatient and pulmonologist/PCP advocating for need of machine if studies revealing that she needs BIPAP (insurance has decline coverage according to patient)  Hx of depression -will continue lexapro. -no SI and no hallucinations  Diabetes mellitus type with hyperglycemia: -due to steroids CBG's running higher than usual -A1C 7.0 -Continue metformin and low carb diet -anticipating improvement in CBG's with steroids tapering; further adjustment to hypoglycemic regimen to be done by PCP in outpatient setting   HLD: will continue statins  HTN: will resume home antihypertensive regimen at discharge and will recommend patient to follow heart healthy diet  GERD/GI protection: will start PPI  Procedures:  See below for x-ray reports   Consultations:  None   Discharge Exam: Filed Vitals:   03/16/15 1001  BP: 149/72  Pulse: 78  Temp: 97.8 F (36.6 C)  Resp: 20   General: Alert, awake, oriented x3, in no acute distress. Breathing a lot better and with good O2 sat on RA. HEENT: No bruits, no goiter, no JVD Heart: Regular rate and rhythm. Lungs: Good air movement, scattered rhonchi, no crackles and no wheezing on today's exam.  Abdomen: Soft, nontender, nondistended, positive bowel sounds.  Neuro: Grossly intact, nonfocal.  Discharge Instructions   Discharge Instructions    Diet - low sodium heart healthy    Complete by:  As directed      Discharge instructions    Complete by:  As directed   Heart healthy and low calorie diet Take medications  as prescribed Follow up with PCP as already scheduled on 03/21/15 Maintain adequate hydration          Current Discharge Medication List    START taking these medications   Details  chlorpheniramine-HYDROcodone (TUSSIONEX) 10-8 MG/5ML SUER Take 5 mLs by mouth every 12 (twelve) hours as needed for cough. Qty: 140 mL, Refills: 0    Ipratropium-Albuterol (COMBIVENT RESPIMAT) 20-100 MCG/ACT AERS respimat Inhale 1 puff into the lungs every 6 (six) hours as needed for wheezing or shortness of breath. Qty: 4 g, Refills: 1    levofloxacin (LEVAQUIN) 750 MG tablet Take 1 tablet (750 mg total) by mouth daily. Qty: 5 tablet, Refills: 0    pantoprazole (PROTONIX) 40 MG tablet Take 1 tablet (40 mg total) by mouth daily at 12 noon. Qty: 30 tablet, Refills: 1    predniSONE (DELTASONE) 20 MG tablet Take 2 tablets by mouth daily X 3 days; then 1 tablet by mouth daily X 3 days; then 1/2 tablet by mouth daily X 3 days and stop prednisone. Qty: 12 tablet, Refills: 0      CONTINUE these medications which have NOT CHANGED   Details  escitalopram (LEXAPRO) 20 MG tablet Take 20 mg by mouth daily.    halobetasol (ULTRAVATE) 0.05 % cream Apply 1 application topically 2 (two) times daily.     metFORMIN (GLUCOPHAGE-XR) 500 MG 24 hr tablet Take 500 mg by mouth 2 (two) times daily.    pravastatin (PRAVACHOL) 40 MG tablet Take 40 mg by mouth daily. Refills: 5    quinapril-hydrochlorothiazide (ACCURETIC) 20-12.5 MG per tablet Take 1 tablet by mouth daily.    vitamin B-12 (CYANOCOBALAMIN) 1000 MCG tablet Take 2,000 mcg by mouth daily.      STOP taking these medications     budesonide-formoterol (SYMBICORT) 80-4.5 MCG/ACT inhaler      clarithromycin (BIAXIN) 500 MG tablet      DM-Doxylamine-Acetaminophen (VICKS NYQUIL COLD & FLU) 15-6.25-325 MG CAPS      furosemide (LASIX) 20 MG tablet        Allergies  Allergen Reactions  . Penicillins Other (See Comments)    Faint, passes out   Follow-up  Information    Follow up with [facility], MD. Go on 03/21/2015.   Specialty:  Internal Medicine   Contact information:   [address] [place] [address] [phone]       The results of significant diagnostics from this hospitalization (including imaging, microbiology, ancillary and laboratory) are listed below for reference.    Significant Diagnostic Studies: Dg Chest 2 View  03/14/2015   CLINICAL DATA:  Pneumonia.  EXAM: CHEST  2 VIEW  COMPARISON:  03/13/2015.  FINDINGS: Mediastinum hilar structures are normal. Cardiomegaly with normal pulmonary vascularity. Low lung volumes with mild bibasilar atelectasis. Partial clearing of right lower lobe infiltrate. No pleural effusion or pneumothorax. Degenerative changes thoracic spine with mild scoliosis.  IMPRESSION: 1. Partial clearing of right lower lobe infiltrate. 2. Low lung volumes with mild bibasilar atelectasis. 3. Cardiomegaly.  No pulmonary venous congestion.   Electronically Signed   By: Marcello Moores  Register   On: 03/14/2015 07:36   Dg Chest Port 1 View  03/13/2015   CLINICAL DATA:  69 year old female with severe shortness of breath and a history of frequent bronchitis.  EXAM: PORTABLE CHEST - 1 VIEW  COMPARISON:  Prior chest x-ray 03/29/2011  FINDINGS: Cardiac and mediastinal contours are within normal limits. Patchy right basilar opacity may represent infiltrate or atelectasis. No pneumothorax, pulmonary edema or large pleural effusion. No suspicious pulmonary mass or nodule. No acute osseous abnormality.  IMPRESSION: Patchy right basilar opacity may reflect atelectasis or infiltrate. Consider dedicated PA and lateral chest x-ray.   Electronically Signed   By: Jacqulynn Cadet M.D.   On: 03/13/2015 15:08   Labs: Basic Metabolic Panel:  Recent Labs Lab 03/13/15 1440  NA 138  K 3.8  CL 101  CO2 26  GLUCOSE 140*  BUN 18  CREATININE 0.91  CALCIUM 9.4   CBC:  Recent Labs Lab 03/13/15 1440  WBC 8.2   NEUTROABS 5.2  HGB 12.7  HCT 40.3  MCV 93.3  PLT 183   BNP (last 3 results)  Recent Labs  03/13/15 1440  BNP 17.7   CBG:  Recent Labs Lab 03/15/15 1121 03/15/15 1558 03/15/15 2050 03/16/15 0622 03/16/15 1118  GLUCAP 264* 299* 196* 181* 178*    Signed:  Barton Dubois  [facility] 03/16/2015, 12:22 PM

## 2015-03-16 NOTE — Progress Notes (Signed)
Pt D/C home per MD order, D/C instructions reviewed with pt, all questions answered. Pt aware of follow up appt., Pt  Instructed to pick up his prescriptions at any pharmacy store.IV removed and site looks clean and intact, Pt verbalized understanding of discharged instructions.    R

## 2015-03-16 NOTE — Care Management Important Message (Signed)
Important Message  Patient Details  Name: Lindsey Sanders MRN: [id number] Date of Birth: Oct 09, 1945   Medicare Important Message Given:  Yes-second notification given    Nathen May 03/16/2015, 9:51 AMImportant Message  Patient Details  Name: Lindsey Sanders MRN: [id number] Date of Birth: 01-04-1946   Medicare Important Message Given:  Yes-second notification given    Nathen May 03/16/2015, 9:51 AM

## 2015-03-21 DIAGNOSIS — E782 Mixed hyperlipidemia: Secondary | ICD-10-CM | POA: Diagnosis not present

## 2015-03-21 DIAGNOSIS — R5383 Other fatigue: Secondary | ICD-10-CM | POA: Diagnosis not present

## 2015-03-21 DIAGNOSIS — R05 Cough: Secondary | ICD-10-CM | POA: Diagnosis not present

## 2015-03-21 DIAGNOSIS — I10 Essential (primary) hypertension: Secondary | ICD-10-CM | POA: Diagnosis not present

## 2015-05-25 DIAGNOSIS — R42 Dizziness and giddiness: Secondary | ICD-10-CM | POA: Diagnosis not present

## 2015-07-27 DIAGNOSIS — E782 Mixed hyperlipidemia: Secondary | ICD-10-CM | POA: Diagnosis not present

## 2015-07-27 DIAGNOSIS — E1165 Type 2 diabetes mellitus with hyperglycemia: Secondary | ICD-10-CM | POA: Diagnosis not present

## 2015-07-27 DIAGNOSIS — I10 Essential (primary) hypertension: Secondary | ICD-10-CM | POA: Diagnosis not present

## 2015-11-22 IMAGING — CR DG CHEST 2V
2 series · 2 of 2 positions shown · non-contrast
Comparison: 03/13/2015.

CLINICAL DATA: Pneumonia.

EXAM:
CHEST  2 VIEW

[chest pa]
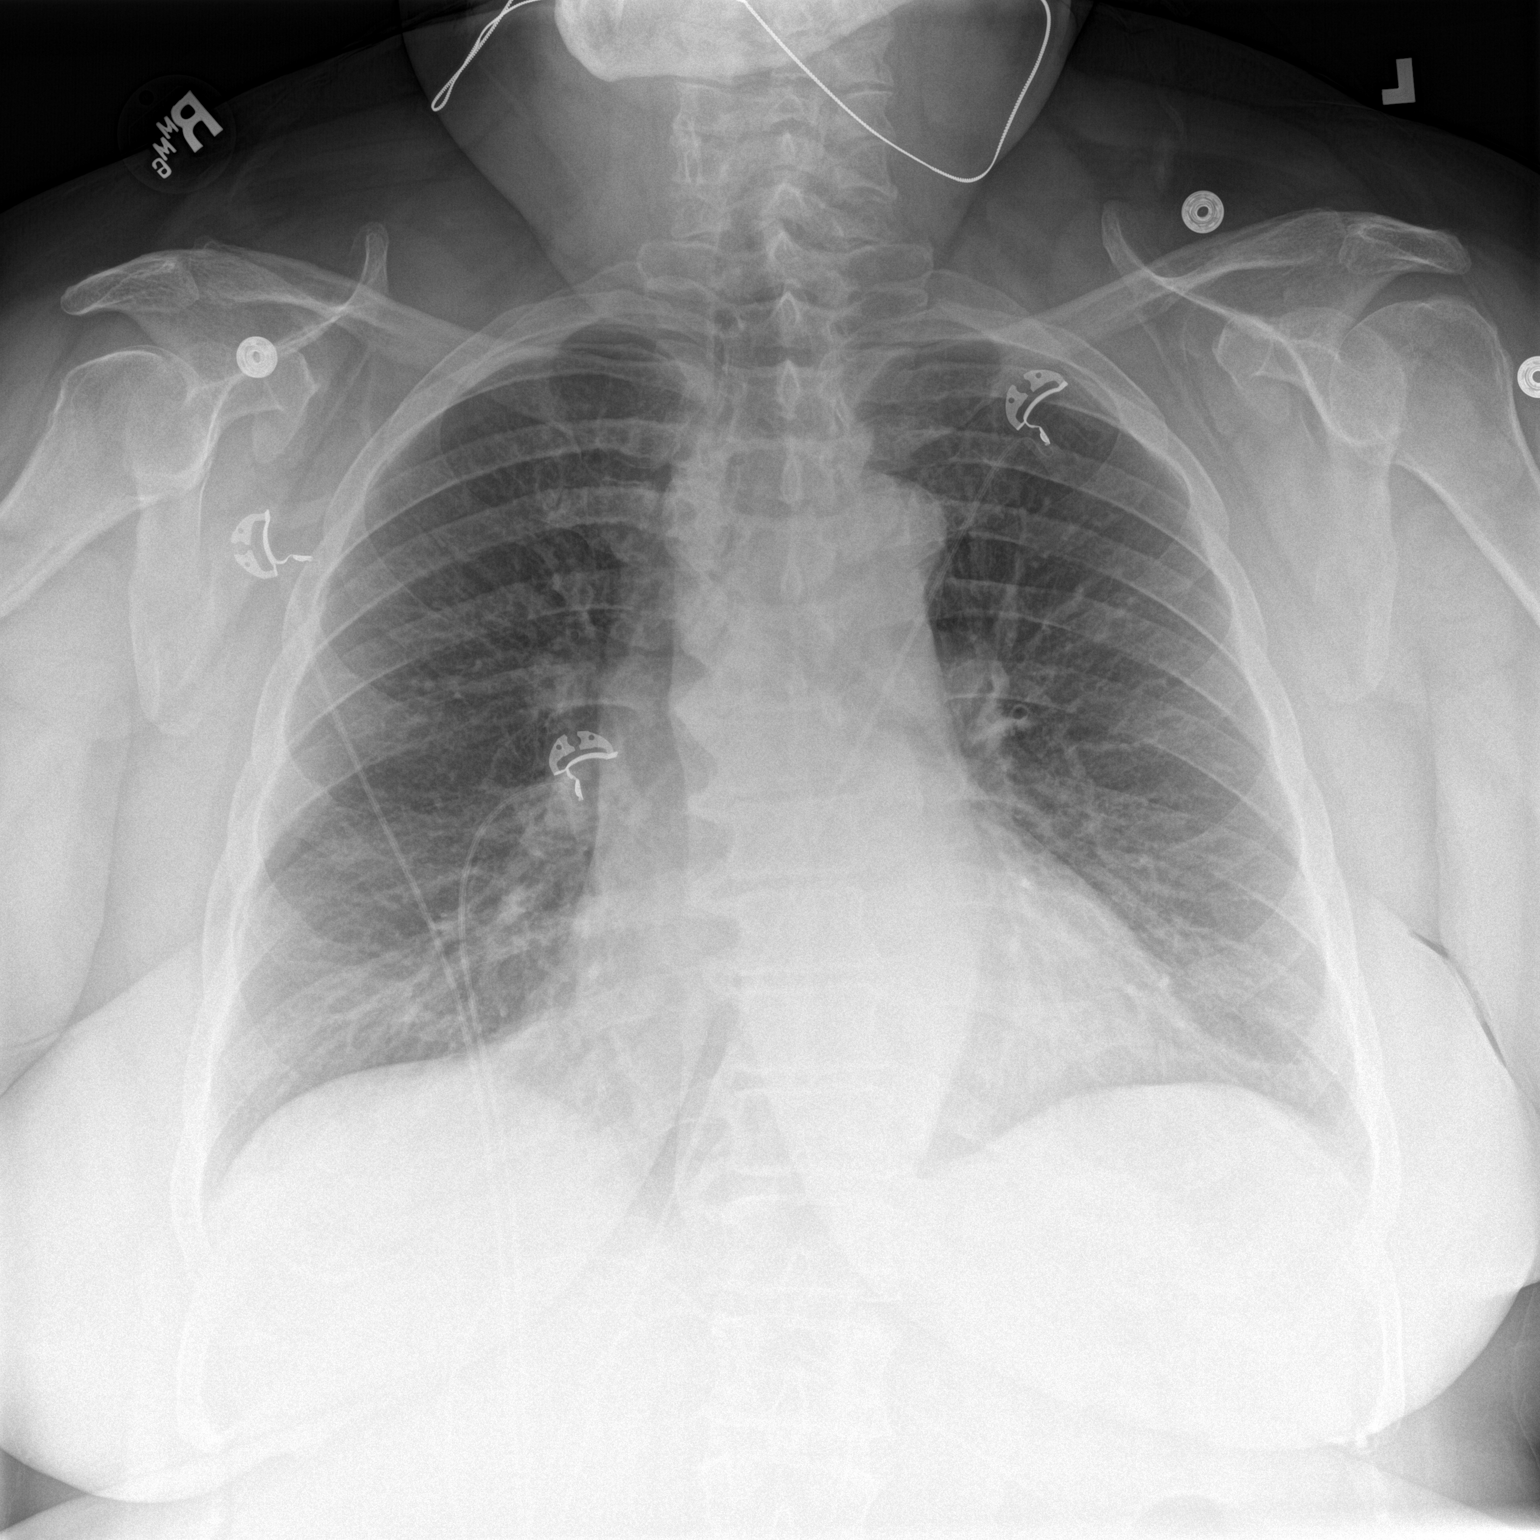

[chest lat]
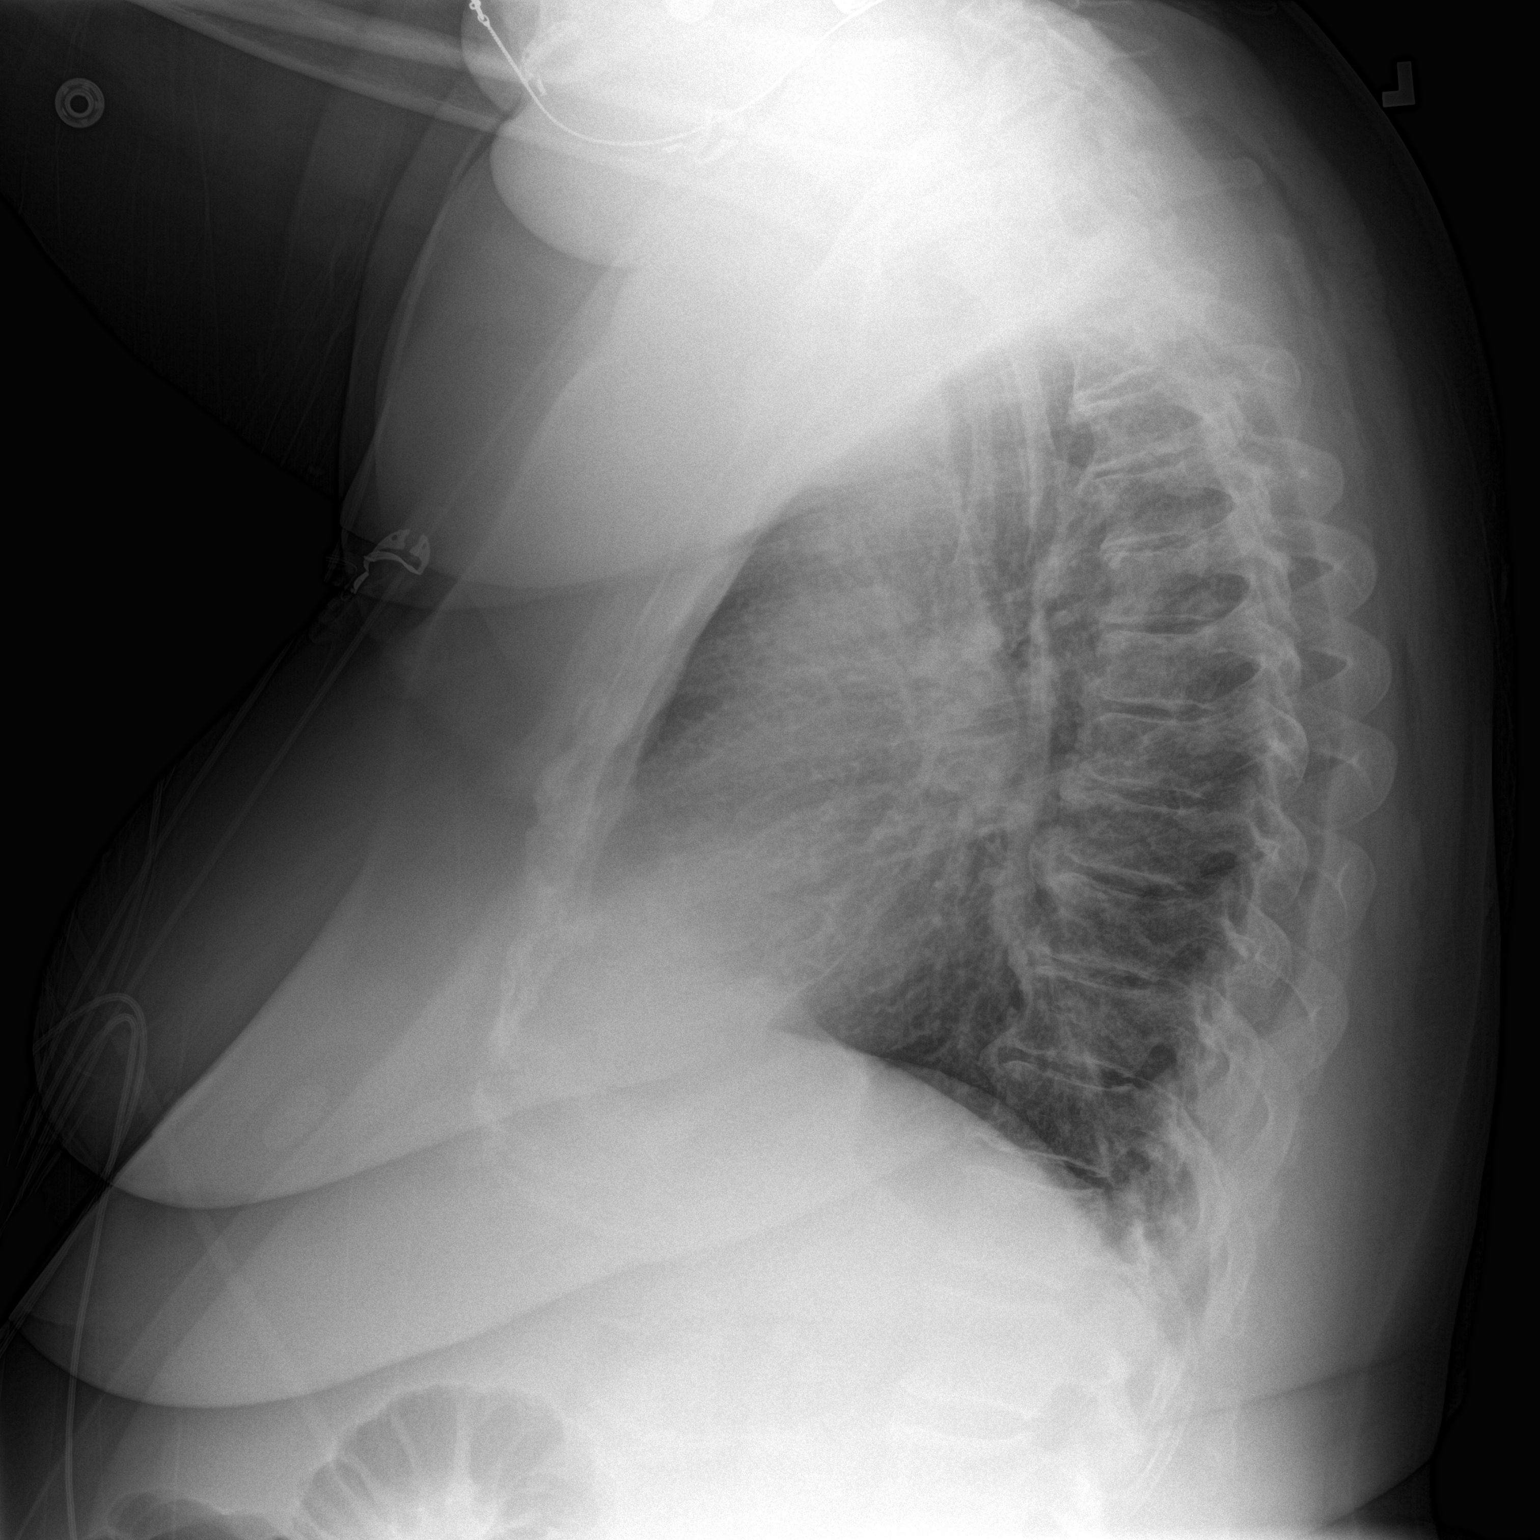

[2 of 2 positions shown; findings below may reference images not displayed]

FINDINGS: Mediastinum hilar structures are normal. Cardiomegaly with normal
pulmonary vascularity. Low lung volumes with mild bibasilar
atelectasis. Partial clearing of right lower lobe infiltrate. No
pleural effusion or pneumothorax. Degenerative changes thoracic
spine with mild scoliosis.
IMPRESSION: 1. Partial clearing of right lower lobe infiltrate.
2. Low lung volumes with mild bibasilar atelectasis.
3. Cardiomegaly.  No pulmonary venous congestion.

## 2015-12-26 DIAGNOSIS — R21 Rash and other nonspecific skin eruption: Secondary | ICD-10-CM | POA: Diagnosis not present

## 2016-03-07 DIAGNOSIS — M170 Bilateral primary osteoarthritis of knee: Secondary | ICD-10-CM | POA: Diagnosis not present

## 2016-03-12 ENCOUNTER — Other Ambulatory Visit (HOSPITAL_COMMUNITY): Payer: Self-pay | Admitting: Surgery

## 2016-03-21 ENCOUNTER — Ambulatory Visit (HOSPITAL_COMMUNITY)
Admission: RE | Admit: 2016-03-21 | Discharge: 2016-03-21 | Disposition: A | Discharge status: Home / Self Care | Payer: BC Managed Care – PPO | Source: Ambulatory Visit | Attending: Surgery | Admitting: Surgery

## 2016-03-21 ENCOUNTER — Other Ambulatory Visit: Payer: Self-pay

## 2016-03-21 DIAGNOSIS — Z01818 Encounter for other preprocedural examination: Secondary | ICD-10-CM | POA: Diagnosis not present

## 2016-03-21 DIAGNOSIS — K224 Dyskinesia of esophagus: Secondary | ICD-10-CM | POA: Insufficient documentation

## 2016-04-17 ENCOUNTER — Ambulatory Visit: Payer: Self-pay | Admitting: Dietician

## 2016-04-18 ENCOUNTER — Ambulatory Visit (INDEPENDENT_AMBULATORY_CARE_PROVIDER_SITE_OTHER): Payer: BC Managed Care – PPO | Admitting: Psychiatry

## 2016-05-01 ENCOUNTER — Encounter: Payer: Self-pay | Admitting: Dietician

## 2016-05-01 ENCOUNTER — Encounter: Payer: BC Managed Care – PPO | Attending: Surgery | Admitting: Dietician

## 2016-05-01 DIAGNOSIS — Z713 Dietary counseling and surveillance: Secondary | ICD-10-CM | POA: Insufficient documentation

## 2016-05-01 NOTE — Progress Notes (Unsigned)
  Pre-Op Assessment Visit:  Pre-Operative *** Surgery  Medical Nutrition Therapy:  Appt start time: 240   End time:  {Time; Appointment:21385}.  Patient was seen on *** for Pre-Operative Nutrition Assessment. Assessment and letter of approval faxed to [facility] Surgery Bariatric Surgery Program coordinator on ***.   Preferred Learning Style: ***  Auditory  Visual  Hands on  No preference indicated   Learning Readiness: ***  Not ready  Contemplating  Ready  Change in progress  Handouts given during visit include:  Pre-Op Goals Bariatric Surgery Protein Shakes   During the appointment today the following Pre-Op Goals were reviewed with the patient: Maintain or lose weight as instructed by your surgeon Make healthy food choices Begin to limit portion sizes Limited concentrated sugars and fried foods Keep fat/sugar in the single digits per serving on   food labels Practice CHEWING your food  (aim for 30 chews per bite or until applesauce consistency) Practice not drinking 15 minutes before, during, and 30 minutes after each meal/snack Avoid all carbonated beverages  Avoid/limit caffeinated beverages  Avoid all sugar-sweetened beverages Consume 3 meals per day; eat every 3-5 hours Make a list of non-food related activities Aim for 64-100 ounces of FLUID daily  Aim for at least 60-80 grams of PROTEIN daily Look for a liquid protein source that contain ?15 g protein and ?5 g carbohydrate  (ex: shakes, drinks, shots)  Patient-Centered Goals: *** specific/non-scale and confidence/importance scale 1-10  Demonstrated degree of understanding via:  Teach Back  Teaching Method Utilized: *** Visual Auditory Hands on  Barriers to learning/adherence to lifestyle change: ***  [facility] to call the [facility] to enroll in Pre-Op and Post-Op Nutrition Education when surgery date is scheduled.

## 2016-05-01 NOTE — Progress Notes (Signed)
  Pre-Op Assessment Visit:  Pre-Operative RYGB Surgery  Medical Nutrition Therapy:  Appt start time: 240   End time:  320.  Patient was seen on 05/01/2016 for Pre-Operative Nutrition Assessment. Assessment and letter of approval faxed to [facility] Surgery Bariatric Surgery Program coordinator on 05/02/2016.   Preferred Learning Style:  No preference indicated   Learning Readiness:   Ready  Handouts given during visit include:  Pre-Op Goals Bariatric Surgery Protein Shakes   During the appointment today the following Pre-Op Goals were reviewed with the patient: Maintain or lose weight as instructed by your surgeon Make healthy food choices Begin to limit portion sizes Limited concentrated sugars and fried foods Keep fat/sugar in the single digits per serving on   food labels Practice CHEWING your food  (aim for 30 chews per bite or until applesauce consistency) Practice not drinking 15 minutes before, during, and 30 minutes after each meal/snack Avoid all carbonated beverages  Avoid/limit caffeinated beverages  Avoid all sugar-sweetened beverages Consume 3 meals per day; eat every 3-5 hours Make a list of non-food related activities Aim for 64-100 ounces of FLUID daily  Aim for at least 60-80 grams of PROTEIN daily Look for a liquid protein source that contain ?15 g protein and ?5 g carbohydrate  (ex: shakes, drinks, shots)  Demonstrated degree of understanding via:  Teach Back  Teaching Method Utilized:  Visual Auditory Hands on  Barriers to learning/adherence to lifestyle change: none  Patient to call the [facility] to enroll in Pre-Op and Post-Op Nutrition Education when surgery date is scheduled.

## 2016-05-02 ENCOUNTER — Encounter: Payer: Self-pay | Admitting: Dietician

## 2016-05-04 DIAGNOSIS — R05 Cough: Secondary | ICD-10-CM | POA: Diagnosis not present

## 2016-05-04 DIAGNOSIS — J209 Acute bronchitis, unspecified: Secondary | ICD-10-CM | POA: Diagnosis not present

## 2016-05-09 ENCOUNTER — Ambulatory Visit: Payer: Self-pay | Admitting: Psychiatry

## 2016-08-01 ENCOUNTER — Ambulatory Visit (INDEPENDENT_AMBULATORY_CARE_PROVIDER_SITE_OTHER): Payer: Medicare Other | Admitting: Psychiatry

## 2016-08-01 DIAGNOSIS — F509 Eating disorder, unspecified: Secondary | ICD-10-CM | POA: Diagnosis not present

## 2016-08-12 ENCOUNTER — Ambulatory Visit: Payer: Self-pay | Admitting: Dietician

## 2016-08-19 ENCOUNTER — Encounter: Payer: BC Managed Care – PPO | Attending: Surgery | Admitting: Dietician

## 2016-08-19 DIAGNOSIS — M545 Low back pain: Secondary | ICD-10-CM | POA: Diagnosis not present

## 2016-08-19 DIAGNOSIS — M1990 Unspecified osteoarthritis, unspecified site: Secondary | ICD-10-CM | POA: Insufficient documentation

## 2016-08-19 DIAGNOSIS — F329 Major depressive disorder, single episode, unspecified: Secondary | ICD-10-CM | POA: Diagnosis not present

## 2016-08-19 DIAGNOSIS — E7800 Pure hypercholesterolemia, unspecified: Secondary | ICD-10-CM | POA: Insufficient documentation

## 2016-08-19 DIAGNOSIS — Z713 Dietary counseling and surveillance: Secondary | ICD-10-CM | POA: Insufficient documentation

## 2016-08-19 DIAGNOSIS — E119 Type 2 diabetes mellitus without complications: Secondary | ICD-10-CM | POA: Insufficient documentation

## 2016-08-19 DIAGNOSIS — G4730 Sleep apnea, unspecified: Secondary | ICD-10-CM | POA: Diagnosis not present

## 2016-08-21 ENCOUNTER — Encounter: Payer: Self-pay | Admitting: Dietician

## 2016-08-21 NOTE — Progress Notes (Signed)
  Pre-Operative Nutrition Class:  Appt start time: 1552   End time:  1830.  Patient was seen on 08/19/2016 for Pre-Operative Bariatric Surgery Education at the [facility].   Surgery date: 09/30/2016 Surgery type: RYGB [facility] weight at [facility]: 282 lbs on 05/01/2016 Weight today: 281.7 lbs  TANITA  BODY COMP RESULTS  08/19/16   BMI (kg/m^2) n/a   Fat Mass (lbs)    Fat Free Mass (lbs)    Total Body Water (lbs)    Samples given per MNT protocol. Patient educated on appropriate usage: Premier protein shake (vanilla - qty 1) Lot #: [id number] Exp: 06/2017  Bariatric Advantage Calcium Citrate chew (lemon - qty 1) Lot #: [id number] Exp: 12/2016  Renee Pain Protein Powder (unflavored - qty 1) Lot #: [id number] Exp: 12/2017  The following the learning objectives were met by the patient during this course:  Identify Pre-Op Dietary Goals and will begin 2 weeks pre-operatively  Identify appropriate sources of fluids and proteins   State protein recommendations and appropriate sources pre and post-operatively  Identify Post-Operative Dietary Goals and will follow for 2 weeks post-operatively  Identify appropriate multivitamin and calcium sources  Describe the need for physical activity post-operatively and will follow MD recommendations  State when to call healthcare provider regarding medication questions or post-operative complications  Handouts given during class include:  Pre-Op Bariatric Surgery Diet Handout  Protein Shake Handout  Post-Op Bariatric Surgery Nutrition Handout  [facility] Program Information Flyer  Support Group Information Flyer  [facility] Bariatric Supplements Price List  Follow-Up Plan: Patient will follow-up at [facility] 2 weeks post operatively for diet advancement per MD.

## 2016-09-24 ENCOUNTER — Ambulatory Visit: Payer: Self-pay | Admitting: Surgery

## 2016-09-25 NOTE — Patient Instructions (Addendum)
Lindsey Sanders  09/25/2016   Your procedure is scheduled on: Monday 09/30/2016  Report to [facility] Main  Entrance take [place]  elevators to 3rd floor to  [place] at [place]  AM.  Call this number if you have problems the morning of surgery [phone]   Remember: ONLY 1 PERSON MAY GO WITH YOU TO SHORT STAY TO GET  READY MORNING OF [place].              Follow Bowel Prep instructions from Dr. Earlie Server office on Sunday 09/29/2016 along with a clear liquid diet all day up until midnight!                 CLEAR LIQUID DIET   Foods Allowed                                                                     Foods Excluded  Coffee and tea, regular and decaf                             liquids that you cannot  Plain Jell-O in any flavor                                             see through such as: Fruit ices (not with fruit pulp)                                     milk, soups, orange juice  Iced Popsicles                                    All solid food Carbonated beverages, regular and diet                                    Cranberry, grape and apple juices Sports drinks like Gatorade Lightly seasoned clear broth or consume(fat free) Sugar, honey syrup  Sample Menu Breakfast                                Lunch                                     Supper Cranberry juice                    Beef broth                            Chicken broth Jell-O  Grape juice                           Apple juice Coffee or tea                        Jell-O                                      Popsicle                                                Coffee or tea                        Coffee or tea  _____________________________________________________________________        How to Manage Your Diabetes Before and After Surgery  Why is it important to control my blood sugar before and after surgery? . Improving blood  sugar levels before and after surgery helps healing and can limit problems. . A way of improving blood sugar control is eating a healthy diet by: o  Eating less sugar and carbohydrates o  Increasing activity/exercise o  Talking with your doctor about reaching your blood sugar goals . High blood sugars (greater than 180 mg/dL) can raise your risk of infections and slow your recovery, so you will need to focus on controlling your diabetes during the weeks before surgery. . Make sure that the doctor who takes care of your diabetes knows about your planned surgery including the date and location.  How do I manage my blood sugar before surgery? . Check your blood sugar at least 4 times a day, starting 2 days before surgery, to make sure that the level is not too high or low. o Check your blood sugar the morning of your surgery when you wake up and every 2 hours until you get to the Short Stay unit. . If your blood sugar is less than 70 mg/dL, you will need to treat for low blood sugar: o Do not take insulin. o Treat a low blood sugar (less than 70 mg/dL) with  cup of clear juice (cranberry or apple), 4 glucose tablets, OR glucose gel. o Recheck blood sugar in 15 minutes after treatment (to make sure it is greater than 70 mg/dL). If your blood sugar is not greater than 70 mg/dL on recheck, call [phone] for further instructions. . Report your blood sugar to the short stay nurse when you get to Short Stay.  . If you are admitted to the hospital after surgery: o Your blood sugar will be checked by the staff and you will probably be given insulin after surgery (instead of oral diabetes medicines) to make sure you have good blood sugar levels. o The goal for blood sugar control after surgery is 80-180 mg/dL.   WHAT DO I DO ABOUT MY DIABETES MEDICATION?         Please take the day before surgery , your usual dose of Januvia!  . Do not take Januvia  medicines (pills) the morning of surgery.-         Do not eat food or drink liquids :After Midnight.     Take these medicines the morning  of surgery with A SIP OF WATER: Escitalopram (Lexapro), use Albuterol inhaler and Symbicort inhaler if needed and bring inhalers you                                 You may not have any metal on your body including hair pins and              piercings  Do not wear jewelry, make-up, lotions, powders or perfumes, deodorant             Do not wear nail polish.  Do not shave  48 hours prior to surgery.              Men may shave face and neck.   Do not bring valuables to the hospital. [place].  Contacts, dentures or bridgework may not be worn into surgery.  Leave suitcase in the car. After surgery it may be brought to your room.                  Please read over the following fact sheets you were given: _____________________________________________________________________             [facility] - Preparing for Surgery Before surgery, you can play an important role.  Because skin is not sterile, your skin needs to be as free of germs as possible.  You can reduce the number of germs on your skin by washing with CHG (chlorahexidine gluconate) soap before surgery.  CHG is an antiseptic cleaner which kills germs and bonds with the skin to continue killing germs even after washing. Please DO NOT use if you have an allergy to CHG or antibacterial soaps.  If your skin becomes reddened/irritated stop using the CHG and inform your nurse when you arrive at Short Stay. Do not shave (including legs and underarms) for at least 48 hours prior to the first CHG shower.  You may shave your face/neck. Please follow these instructions carefully:  1.  Shower with CHG Soap the night before surgery and the  morning of Surgery.  2.  If you choose to wash your hair, wash your hair first as usual with your  normal  shampoo.  3.  After you shampoo, rinse your hair and  body thoroughly to remove the  shampoo.                           4.  Use CHG as you would any other liquid soap.  You can apply chg directly  to the skin and wash                       Gently with a scrungie or clean washcloth.  5.  Apply the CHG Soap to your body ONLY FROM THE NECK DOWN.   Do not use on face/ open                           Wound or open sores. Avoid contact with eyes, ears mouth and genitals (private parts).                       Wash face,  Genitals (private parts) with your normal soap.  6.  Wash thoroughly, paying special attention to the area where your surgery  will be performed.  7.  Thoroughly rinse your body with warm water from the neck down.  8.  DO NOT shower/wash with your normal soap after using and rinsing off  the CHG Soap.                9.  Pat yourself dry with a clean towel.            10.  Wear clean pajamas.            11.  Place clean sheets on your bed the night of your first shower and do not  sleep with pets. Day of Surgery : Do not apply any lotions/deodorants the morning of surgery.  Please wear clean clothes to the hospital/surgery center.  FAILURE TO FOLLOW THESE INSTRUCTIONS MAY RESULT IN THE CANCELLATION OF YOUR SURGERY PATIENT SIGNATURE_________________________________  NURSE SIGNATURE__________________________________  ________________________________________________________________________   Adam Phenix  An incentive spirometer is a tool that can help keep your lungs clear and active. This tool measures how well you are filling your lungs with each breath. Taking long deep breaths may help reverse or decrease the chance of developing breathing (pulmonary) problems (especially infection) following:  A long period of time when you are unable to move or be active. BEFORE THE PROCEDURE   If the spirometer includes an indicator to show your best effort, your nurse or respiratory therapist will set it to a desired  goal.  If possible, sit up straight or lean slightly forward. Try not to slouch.  Hold the incentive spirometer in an upright position. INSTRUCTIONS FOR USE  1. Sit on the edge of your bed if possible, or sit up as far as you can in bed or on a chair. 2. Hold the incentive spirometer in an upright position. 3. Breathe out normally. 4. Place the mouthpiece in your mouth and seal your lips tightly around it. 5. Breathe in slowly and as deeply as possible, raising the piston or the ball toward the top of the column. 6. Hold your breath for 3-5 seconds or for as long as possible. Allow the piston or ball to fall to the bottom of the column. 7. Remove the mouthpiece from your mouth and breathe out normally. 8. Rest for a few seconds and repeat Steps 1 through 7 at least 10 times every 1-2 hours when you are awake. Take your time and take a few normal breaths between deep breaths. 9. The spirometer may include an indicator to show your best effort. Use the indicator as a goal to work toward during each repetition. 10. After each set of 10 deep breaths, practice coughing to be sure your lungs are clear. If you have an incision (the cut made at the time of surgery), support your incision when coughing by placing a pillow or rolled up towels firmly against it. Once you are able to get out of bed, walk around indoors and cough well. You may stop using the incentive spirometer when instructed by your caregiver.  RISKS AND COMPLICATIONS  Take your time so you do not get dizzy or light-headed.  If you are in pain, you may need to take or ask for pain medication before doing incentive spirometry. It is harder to take a deep breath if you are having pain. AFTER USE  Rest and breathe slowly and easily.  It can be helpful to keep track of a log of your  progress. Your caregiver can provide you with a simple table to help with this. If you are using the spirometer at home, follow these instructions: [place] IF:   You are having difficultly using the spirometer.  You have trouble using the spirometer as often as instructed.  Your pain medication is not giving enough relief while using the spirometer.  You develop fever of 100.5 F (38.1 C) or higher. SEEK IMMEDIATE MEDICAL CARE IF:   You cough up bloody sputum that had not been present before.  You develop fever of 102 F (38.9 C) or greater.  You develop worsening pain at or near the incision site. MAKE SURE YOU:   Understand these instructions.  Will watch your condition.  Will get help right away if you are not doing well or get worse. Document Released: 11/04/2006 Document Revised: 09/16/2011 Document Reviewed: 01/05/2007 [facility] Patient Information 2014 [place], [place].   ________________________________________________________________________

## 2016-09-25 NOTE — Progress Notes (Signed)
09/04/2016- Pre-operative clearance from Dr. Merrilee Seashore on chart and labs- CBC w/ diff., CMP, HGA1C

## 2016-09-26 ENCOUNTER — Encounter (INDEPENDENT_AMBULATORY_CARE_PROVIDER_SITE_OTHER): Payer: Self-pay

## 2016-09-26 ENCOUNTER — Encounter (HOSPITAL_COMMUNITY)
Admission: RE | Admit: 2016-09-26 | Discharge: 2016-09-26 | Disposition: A | Discharge status: Home / Self Care | Payer: BC Managed Care – PPO | Source: Ambulatory Visit | Attending: Surgery | Admitting: Surgery

## 2016-09-26 ENCOUNTER — Encounter (HOSPITAL_COMMUNITY): Payer: Self-pay

## 2016-09-26 DIAGNOSIS — Z6841 Body Mass Index (BMI) 40.0 and over, adult: Secondary | ICD-10-CM | POA: Diagnosis not present

## 2016-09-26 DIAGNOSIS — Z01812 Encounter for preprocedural laboratory examination: Secondary | ICD-10-CM | POA: Insufficient documentation

## 2016-09-26 DIAGNOSIS — E119 Type 2 diabetes mellitus without complications: Secondary | ICD-10-CM | POA: Insufficient documentation

## 2016-09-26 DIAGNOSIS — E669 Obesity, unspecified: Secondary | ICD-10-CM | POA: Diagnosis not present

## 2016-09-26 HISTORY — DX: Family history of other specified conditions: Z84.89

## 2016-09-26 HISTORY — DX: Other specified postprocedural states: R11.2

## 2016-09-26 HISTORY — DX: Dyspnea, unspecified: R06.00

## 2016-09-26 HISTORY — DX: Other specified postprocedural states: Z98.890

## 2016-09-26 HISTORY — DX: Other complications of anesthesia, initial encounter: T88.59XA

## 2016-09-26 HISTORY — DX: Adverse effect of unspecified anesthetic, initial encounter: T41.45XA

## 2016-09-26 LAB — COMPREHENSIVE METABOLIC PANEL
ALT: 24 U/L (ref 14–54)
AST: 21 U/L (ref 15–41)
Albumin: 4.2 g/dL (ref 3.5–5.0)
Alkaline Phosphatase: 57 U/L (ref 38–126)
Anion gap: 7 (ref 5–15)
BILIRUBIN TOTAL: 0.6 mg/dL (ref 0.3–1.2)
BUN: 22 mg/dL — AB (ref 6–20)
CO2: 28 mmol/L (ref 22–32)
Calcium: 9.6 mg/dL (ref 8.9–10.3)
Chloride: 105 mmol/L (ref 101–111)
Creatinine, Ser: 0.84 mg/dL (ref 0.44–1.00)
Glucose, Bld: 118 mg/dL — ABNORMAL HIGH (ref 65–99)
POTASSIUM: 4.7 mmol/L (ref 3.5–5.1)
Sodium: 140 mmol/L (ref 135–145)
TOTAL PROTEIN: 7.5 g/dL (ref 6.5–8.1)

## 2016-09-26 LAB — CBC WITH DIFFERENTIAL/PLATELET
Basophils Absolute: 0 10*3/uL (ref 0.0–0.1)
Basophils Relative: 0 %
EOS PCT: 2 %
Eosinophils Absolute: 0.2 10*3/uL (ref 0.0–0.7)
HCT: 37.8 % (ref 36.0–46.0)
Hemoglobin: 12 g/dL (ref 12.0–15.0)
LYMPHS ABS: 2.1 10*3/uL (ref 0.7–4.0)
LYMPHS PCT: 25 %
MCH: 27.6 pg (ref 26.0–34.0)
MCHC: 31.7 g/dL (ref 30.0–36.0)
MCV: 87.1 fL (ref 78.0–100.0)
Monocytes Absolute: 0.5 10*3/uL (ref 0.1–1.0)
Monocytes Relative: 7 %
NEUTROS PCT: 66 %
Neutro Abs: 5.5 10*3/uL (ref 1.7–7.7)
Platelets: 221 10*3/uL (ref 150–400)
RBC: 4.34 MIL/uL (ref 3.87–5.11)
RDW: 13.6 % (ref 11.5–15.5)
WBC: 8.3 10*3/uL (ref 4.0–10.5)

## 2016-09-26 LAB — GLUCOSE, CAPILLARY: Glucose-Capillary: 132 mg/dL — ABNORMAL HIGH (ref 65–99)

## 2016-09-26 NOTE — H&P (Signed)
Chief Complaint:  Morbid obesity and DM  History of Present Illness:  Lindsey Sanders is an 71 y.o. female  Who is here for gastric bypass surgery.  The patient is a 71 year old female who presents for a bariatric surgery evaluation. Symptoms include excess weight, inability to lose weight, dyspnea, excessive sweating (panniculitis especially on the right with intense itching) and knee pain (right knee requiring injections). Associated symptoms include tinea corpora, dark skin lesions and skin infections. Reported interest in weight loss is high. Diets tried in the past include low calorie. She works for [employer] at [employer]. She has had DM x 2 years. Only prior abdominal surgery is BTL by Dr. Molli Hazard.  We have talked with her about risks and she wants to proceed with roux en y gastric bypass surgery.  \  She has seen and been cleared for surgery by Dr. Royetta Asal.  She has lost>20lbs on a preop diet and reports better exercise tolerance.    Past Medical History:  Diagnosis Date  . Complication of anesthesia   . Depression   . Diabetes mellitus without complication ([place])   . Dyspnea   . Family history of adverse reaction to anesthesia    sister has nausea and vomiting after surgery  . Hypertension   . Morbid obesity ([place])   . PONV (postoperative nausea and vomiting)     Past Surgical History:  Procedure Laterality Date  . BREAST SURGERY    . EYE SURGERY     bilateral cataract surgery   . KNEE SURGERY    . TUBAL LIGATION      No current facility-administered medications for this encounter.    Current Outpatient Prescriptions  Medication Sig Dispense Refill  . acetaminophen (TYLENOL) 500 MG tablet Take 500 mg by mouth every 6 (six) hours as needed (for pain/headache.).    Marland Kitchen albuterol (PROVENTIL HFA;VENTOLIN HFA) 108 (90 Base) MCG/ACT inhaler Inhale into the lungs every 6 (six) hours as needed for wheezing or shortness of breath.    .  budesonide-formoterol (SYMBICORT) 160-4.5 MCG/ACT inhaler Inhale 2 puffs into the lungs 2 (two) times daily as needed (for respiratory issues.).     Marland Kitchen escitalopram (LEXAPRO) 20 MG tablet Take 20 mg by mouth daily.    . furosemide (LASIX) 20 MG tablet Take 20 mg by mouth daily as needed for fluid.     Marland Kitchen JANUVIA 100 MG tablet Take 100 mg by mouth daily.  5  . ketoconazole (NIZORAL) 2 % cream Apply 1 application topically daily as needed. For rash/eczema.  2  . naproxen sodium (ANAPROX) 220 MG tablet Take 220 mg by mouth 2 (two) times daily as needed (for pain/headache.).    Marland Kitchen pravastatin (PRAVACHOL) 40 MG tablet Take 40 mg by mouth daily before lunch.     . quinapril-hydrochlorothiazide (ACCURETIC) 20-12.5 MG per tablet Take 1 tablet by mouth daily.    Marland Kitchen sulfamethoxazole-trimethoprim (BACTRIM DS,SEPTRA DS) 800-160 MG tablet Take 1 tablet by mouth 2 (two) times daily.  0  . tolnaftate (TINACTIN) 1 % powder Apply 1 application topically 4 (four) times daily as needed (for fungus/skin folds).     Metformin and Penicillins Family History  Problem Relation Age of Onset  . Diabetes Mother   . Diabetes Other   . Cancer Other    Social History:   reports that she has never smoked. She has never used smokeless tobacco. She reports that she does not drink alcohol or  use drugs.   REVIEW OF SYSTEMS : Negative except for see problem list  Physical Exam:   There were no vitals taken for this visit. There is no height or weight on file to calculate BMI.  Gen:  WDWN WF NAD  Neurological: Alert and oriented to person, place, and time. Motor and sensory function is grossly intact  Head: Normocephalic and atraumatic.  Eyes: Conjunctivae are normal. Pupils are equal, round, and reactive to light. No scleral icterus.  Neck: Normal range of motion. Neck supple. No tracheal deviation or thyromegaly present.  Cardiovascular:  SR without murmurs or gallops.  No carotid bruits Breast:  Not  examined Respiratory: Effort normal.  No respiratory distress. No chest wall tenderness. Breath sounds normal.  No wheezes, rales or rhonchi.  Abdomen:  Obese with chronic scarring in the pannus worse on the left GU:  Scarring from weight pannus issues in the mons area Musculoskeletal: Normal range of motion. Extremities are nontender. No cyanosis, edema or clubbing noted Lymphadenopathy: No cervical, preauricular, postauricular or axillary adenopathy is present Skin: Skin is warm and dry. No rash noted. No diaphoresis. No erythema. No pallor. Pscyh: Normal mood and affect. Behavior is normal. Judgment and thought content normal.   LABORATORY RESULTS: Results for orders placed or performed during the hospital encounter of 09/26/16 (from the past 48 hour(s))  Glucose, capillary     Status: Abnormal   Collection Time: 09/26/16  8:20 AM  Result Value Ref Range   Glucose-Capillary 132 (H) 65 - 99 mg/dL  CBC WITH DIFFERENTIAL     Status: None   Collection Time: 09/26/16  8:59 AM  Result Value Ref Range   WBC 8.3 4.0 - 10.5 K/uL   RBC 4.34 3.87 - 5.11 MIL/uL   Hemoglobin 12.0 12.0 - 15.0 g/dL   HCT 37.8 36.0 - 46.0 %   MCV 87.1 78.0 - 100.0 fL   MCH 27.6 26.0 - 34.0 pg   MCHC 31.7 30.0 - 36.0 g/dL   RDW 13.6 11.5 - 15.5 %   Platelets 221 150 - 400 K/uL   Neutrophils Relative % 66 %   Neutro Abs 5.5 1.7 - 7.7 K/uL   Lymphocytes Relative 25 %   Lymphs Abs 2.1 0.7 - 4.0 K/uL   Monocytes Relative 7 %   Monocytes Absolute 0.5 0.1 - 1.0 K/uL   Eosinophils Relative 2 %   Eosinophils Absolute 0.2 0.0 - 0.7 K/uL   Basophils Relative 0 %   Basophils Absolute 0.0 0.0 - 0.1 K/uL  Comprehensive metabolic panel     Status: Abnormal   Collection Time: 09/26/16  8:59 AM  Result Value Ref Range   Sodium 140 135 - 145 mmol/L   Potassium 4.7 3.5 - 5.1 mmol/L   Chloride 105 101 - 111 mmol/L   CO2 28 22 - 32 mmol/L   Glucose, Bld 118 (H) 65 - 99 mg/dL   BUN 22 (H) 6 - 20 mg/dL   Creatinine, Ser  0.84 0.44 - 1.00 mg/dL   Calcium 9.6 8.9 - 10.3 mg/dL   Total Protein 7.5 6.5 - 8.1 g/dL   Albumin 4.2 3.5 - 5.0 g/dL   AST 21 15 - 41 U/L   ALT 24 14 - 54 U/L   Alkaline Phosphatase 57 38 - 126 U/L   Total Bilirubin 0.6 0.3 - 1.2 mg/dL   GFR calc non Af Amer >60 >60 mL/min   GFR calc Af Amer >60 >60 mL/min    Comment: (NOTE)  The eGFR has been calculated using the CKD EPI equation. This calculation has not been validated in all clinical situations. eGFR's persistently <60 mL/min signify possible Chronic Kidney Disease.    Anion gap 7 5 - 15     RADIOLOGY RESULTS: No results found.  Problem List: Patient Active Problem List   Diagnosis Date Noted  . Esophageal reflux   . Morbid obesity ([place])   . HLD (hyperlipidemia)   . CAP (community acquired pneumonia) 03/13/2015  . Diabetes mellitus ([place]) 03/13/2015  . Reactive airway disease 03/13/2015  . Respiratory failure, acute ([place]) 03/13/2015    Assessment & Plan: Morbid obesity with DM for roux en Y gastric bypass    Matt B. Hassell Done, MD, [facility] [phone] beeper [phone]  09/26/2016 10:46 AM

## 2016-09-26 NOTE — Progress Notes (Signed)
Called Dr. Irven Shelling office and requested them to Fax Pre-operative  clearance Last office visit note, Nuclear Study test results, Echo results.

## 2016-09-26 NOTE — Progress Notes (Signed)
09/12/2016- Cardiac Pre-operative clearance from Dr. Einar Gip on chart.  09/18/2016- EKG on chart from Dr. Einar Gip and office visit   09/20/2016- Myocardial Perfusion Imaging stress test at Dr. Irven Shelling on chart.  09/19/2016- Transthoracic Echocardiogram [place] Report from Dr. Einar Gip on chart.

## 2016-09-30 ENCOUNTER — Encounter (HOSPITAL_COMMUNITY)
Admission: RE | Disposition: A | Discharge status: Home / Self Care | Payer: Self-pay | Source: Ambulatory Visit | Attending: Surgery

## 2016-09-30 ENCOUNTER — Inpatient Hospital Stay (HOSPITAL_COMMUNITY): Payer: BC Managed Care – PPO | Admitting: Certified Registered Nurse Anesthetist

## 2016-09-30 ENCOUNTER — Encounter (HOSPITAL_COMMUNITY): Payer: Self-pay | Admitting: Certified Registered Nurse Anesthetist

## 2016-09-30 ENCOUNTER — Inpatient Hospital Stay (HOSPITAL_COMMUNITY)
Admission: RE | Admit: 2016-09-30 | Discharge: 2016-10-03 | DRG: 982 | Disposition: A | Discharge status: Home / Self Care | Payer: BC Managed Care – PPO | Source: Ambulatory Visit | Attending: Surgery | Admitting: Surgery

## 2016-09-30 DIAGNOSIS — K219 Gastro-esophageal reflux disease without esophagitis: Secondary | ICD-10-CM | POA: Diagnosis present

## 2016-09-30 DIAGNOSIS — I10 Essential (primary) hypertension: Secondary | ICD-10-CM | POA: Diagnosis present

## 2016-09-30 DIAGNOSIS — J45909 Unspecified asthma, uncomplicated: Secondary | ICD-10-CM | POA: Diagnosis present

## 2016-09-30 DIAGNOSIS — Z6841 Body Mass Index (BMI) 40.0 and over, adult: Secondary | ICD-10-CM

## 2016-09-30 DIAGNOSIS — Z833 Family history of diabetes mellitus: Secondary | ICD-10-CM | POA: Diagnosis not present

## 2016-09-30 DIAGNOSIS — E785 Hyperlipidemia, unspecified: Secondary | ICD-10-CM | POA: Diagnosis present

## 2016-09-30 DIAGNOSIS — Z9884 Bariatric surgery status: Secondary | ICD-10-CM

## 2016-09-30 DIAGNOSIS — G4733 Obstructive sleep apnea (adult) (pediatric): Secondary | ICD-10-CM | POA: Diagnosis not present

## 2016-09-30 DIAGNOSIS — Z7984 Long term (current) use of oral hypoglycemic drugs: Secondary | ICD-10-CM | POA: Diagnosis not present

## 2016-09-30 DIAGNOSIS — F329 Major depressive disorder, single episode, unspecified: Secondary | ICD-10-CM | POA: Diagnosis present

## 2016-09-30 DIAGNOSIS — E119 Type 2 diabetes mellitus without complications: Secondary | ICD-10-CM | POA: Diagnosis present

## 2016-09-30 DIAGNOSIS — E662 Morbid (severe) obesity with alveolar hypoventilation: Secondary | ICD-10-CM | POA: Diagnosis present

## 2016-09-30 HISTORY — PX: GASTRIC ROUX-EN-Y: SHX5262

## 2016-09-30 LAB — GLUCOSE, CAPILLARY
GLUCOSE-CAPILLARY: 144 mg/dL — AB (ref 65–99)
GLUCOSE-CAPILLARY: 229 mg/dL — AB (ref 65–99)
GLUCOSE-CAPILLARY: 204 mg/dL — AB (ref 65–99)
Glucose-Capillary: 219 mg/dL — ABNORMAL HIGH (ref 65–99)

## 2016-09-30 LAB — CBC
HEMATOCRIT: 38 % (ref 36.0–46.0)
HEMOGLOBIN: 11.9 g/dL — AB (ref 12.0–15.0)
MCH: 27.7 pg (ref 26.0–34.0)
MCHC: 31.3 g/dL (ref 30.0–36.0)
MCV: 88.4 fL (ref 78.0–100.0)
Platelets: 194 10*3/uL (ref 150–400)
RBC: 4.3 MIL/uL (ref 3.87–5.11)
RDW: 13.7 % (ref 11.5–15.5)
WBC: 13 10*3/uL — ABNORMAL HIGH (ref 4.0–10.5)

## 2016-09-30 LAB — CREATININE, SERUM
Creatinine, Ser: 0.97 mg/dL (ref 0.44–1.00)
GFR calc Af Amer: >60 mL/min (ref >60)
GFR, EST NON AFRICAN AMERICAN: 57 mL/min — AB (ref >60)

## 2016-09-30 SURGERY — LAPAROSCOPIC ROUX-EN-Y GASTRIC BYPASS WITH UPPER ENDOSCOPY
Anesthesia: General

## 2016-09-30 MED ORDER — ROCURONIUM BROMIDE 50 MG/5ML IV SOSY
PREFILLED_SYRINGE | INTRAVENOUS | Status: AC
  Filled 2016-09-30: qty 5

## 2016-09-30 MED ORDER — 0.9 % SODIUM CHLORIDE (POUR BTL) OPTIME
TOPICAL | Status: DC | PRN
  Administered 2016-09-30: 1000 mL

## 2016-09-30 MED ORDER — PREMIER PROTEIN SHAKE
2.0000 [oz_av] | ORAL | Status: DC
  Administered 2016-10-02 – 2016-10-03 (×12): 2 [oz_av] via ORAL

## 2016-09-30 MED ORDER — DEXAMETHASONE SODIUM PHOSPHATE 10 MG/ML IJ SOLN
INTRAMUSCULAR | Status: DC | PRN
  Administered 2016-09-30: 4 mg via INTRAVENOUS

## 2016-09-30 MED ORDER — SUCCINYLCHOLINE CHLORIDE 200 MG/10ML IV SOSY
PREFILLED_SYRINGE | INTRAVENOUS | Status: AC
Start: 2016-09-30 — End: 2016-09-30
  Filled 2016-09-30: qty 10

## 2016-09-30 MED ORDER — LIDOCAINE 2% (20 MG/ML) 5 ML SYRINGE
INTRAMUSCULAR | Status: AC
  Filled 2016-09-30: qty 15

## 2016-09-30 MED ORDER — ACETAMINOPHEN 160 MG/5ML PO SOLN
325.0000 mg | ORAL | Status: DC | PRN
Start: 2016-10-01 — End: 2016-10-03
  Administered 2016-10-01 – 2016-10-03 (×4): 650 mg via ORAL
  Filled 2016-09-30 (×5): qty 20.3

## 2016-09-30 MED ORDER — LIDOCAINE 2% (20 MG/ML) 5 ML SYRINGE
INTRAMUSCULAR | Status: DC | PRN
  Administered 2016-09-30: 60 mg via INTRAVENOUS

## 2016-09-30 MED ORDER — PROPOFOL 10 MG/ML IV BOLUS
INTRAVENOUS | Status: DC | PRN
Start: 2016-09-30 — End: 2016-09-30
  Administered 2016-09-30: 250 mg via INTRAVENOUS

## 2016-09-30 MED ORDER — ALBUTEROL SULFATE HFA 108 (90 BASE) MCG/ACT IN AERS
INHALATION_SPRAY | RESPIRATORY_TRACT | Status: AC
  Filled 2016-09-30: qty 6.7

## 2016-09-30 MED ORDER — PROPOFOL 10 MG/ML IV BOLUS
INTRAVENOUS | Status: AC
Start: 2016-09-30 — End: 2016-09-30
  Filled 2016-09-30: qty 40

## 2016-09-30 MED ORDER — SCOPOLAMINE 1 MG/3DAYS TD PT72
1.0000 | MEDICATED_PATCH | TRANSDERMAL | Status: DC
  Administered 2016-09-30: 1.5 mg via TRANSDERMAL
  Filled 2016-09-30: qty 1

## 2016-09-30 MED ORDER — LACTATED RINGERS IR SOLN
Status: DC | PRN
  Administered 2016-09-30: 1000 mL

## 2016-09-30 MED ORDER — TISSEEL VH 10 ML EX KIT
PACK | CUTANEOUS | Status: AC
  Filled 2016-09-30: qty 1

## 2016-09-30 MED ORDER — MEPERIDINE HCL 50 MG/ML IJ SOLN: 6.2500 mg | INTRAMUSCULAR | Status: DC | PRN

## 2016-09-30 MED ORDER — CEFOTETAN DISODIUM-DEXTROSE 2-2.08 GM-% IV SOLR
INTRAVENOUS | Status: AC
  Filled 2016-09-30: qty 50

## 2016-09-30 MED ORDER — HYDROMORPHONE HCL 1 MG/ML IJ SOLN
INTRAMUSCULAR | Status: AC
  Filled 2016-09-30: qty 1

## 2016-09-30 MED ORDER — LACTATED RINGERS IV SOLN
INTRAVENOUS | Status: DC | PRN
  Administered 2016-09-30 (×2): via INTRAVENOUS

## 2016-09-30 MED ORDER — HEPARIN SODIUM (PORCINE) 5000 UNIT/ML IJ SOLN
5000.0000 [IU] | Freq: Three times a day (TID) | INTRAMUSCULAR | Status: DC
  Administered 2016-10-01 – 2016-10-03 (×7): 5000 [IU] via SUBCUTANEOUS
  Filled 2016-09-30 (×7): qty 1

## 2016-09-30 MED ORDER — SODIUM CHLORIDE 0.9 % IJ SOLN
INTRAMUSCULAR | Status: DC | PRN
  Administered 2016-09-30: 10 mL

## 2016-09-30 MED ORDER — APREPITANT 40 MG PO CAPS
40.0000 mg | ORAL_CAPSULE | ORAL | Status: AC
  Administered 2016-09-30: 40 mg via ORAL
  Filled 2016-09-30: qty 1

## 2016-09-30 MED ORDER — PROMETHAZINE HCL 25 MG/ML IJ SOLN
6.2500 mg | INTRAMUSCULAR | Status: DC | PRN
Start: 2016-09-30 — End: 2016-09-30

## 2016-09-30 MED ORDER — CHLORHEXIDINE GLUCONATE CLOTH 2 % EX PADS: 6.0000 | MEDICATED_PAD | Freq: Once | CUTANEOUS | Status: DC

## 2016-09-30 MED ORDER — ACETAMINOPHEN 325 MG PO TABS: 650.0000 mg | ORAL_TABLET | ORAL | Status: DC | PRN

## 2016-09-30 MED ORDER — ACETAMINOPHEN 500 MG PO TABS
1000.0000 mg | ORAL_TABLET | ORAL | Status: AC
  Administered 2016-09-30: 1000 mg via ORAL
  Filled 2016-09-30: qty 2

## 2016-09-30 MED ORDER — ALBUTEROL SULFATE HFA 108 (90 BASE) MCG/ACT IN AERS
INHALATION_SPRAY | RESPIRATORY_TRACT | Status: DC | PRN
  Administered 2016-09-30: 5 via RESPIRATORY_TRACT

## 2016-09-30 MED ORDER — BUPIVACAINE LIPOSOME 1.3 % IJ SUSP
20.0000 mL | Freq: Once | INTRAMUSCULAR | Status: AC
  Administered 2016-09-30: 20 mL
  Filled 2016-09-30: qty 20

## 2016-09-30 MED ORDER — TISSEEL VH 10 ML EX KIT
PACK | CUTANEOUS | Status: DC | PRN
  Administered 2016-09-30: 1

## 2016-09-30 MED ORDER — ROCURONIUM BROMIDE 50 MG/5ML IV SOSY
PREFILLED_SYRINGE | INTRAVENOUS | Status: DC | PRN
  Administered 2016-09-30: 10 mg via INTRAVENOUS
  Administered 2016-09-30: 20 mg via INTRAVENOUS
  Administered 2016-09-30: 50 mg via INTRAVENOUS
  Administered 2016-09-30 (×4): 10 mg via INTRAVENOUS

## 2016-09-30 MED ORDER — OXYCODONE HCL 5 MG PO TABS: 5.0000 mg | ORAL_TABLET | Freq: Once | ORAL | Status: DC | PRN

## 2016-09-30 MED ORDER — GABAPENTIN 300 MG PO CAPS
300.0000 mg | ORAL_CAPSULE | ORAL | Status: AC
  Administered 2016-09-30: 300 mg via ORAL
  Filled 2016-09-30: qty 1

## 2016-09-30 MED ORDER — SODIUM CHLORIDE 0.9 % IJ SOLN
INTRAMUSCULAR | Status: AC
  Filled 2016-09-30: qty 10

## 2016-09-30 MED ORDER — CEFOTETAN DISODIUM-DEXTROSE 2-2.08 GM-% IV SOLR
2.0000 g | INTRAVENOUS | Status: AC
Start: 2016-09-30 — End: 2016-09-30
  Administered 2016-09-30: 2 g via INTRAVENOUS

## 2016-09-30 MED ORDER — CELECOXIB 200 MG PO CAPS
400.0000 mg | ORAL_CAPSULE | ORAL | Status: AC
  Administered 2016-09-30: 400 mg via ORAL
  Filled 2016-09-30: qty 2

## 2016-09-30 MED ORDER — KCL IN DEXTROSE-NACL 20-5-0.45 MEQ/L-%-% IV SOLN
INTRAVENOUS | Status: DC
  Administered 2016-09-30 – 2016-10-02 (×3): via INTRAVENOUS
  Administered 2016-10-02 (×2): 100 mL/h via INTRAVENOUS
  Administered 2016-10-03: 10:00:00 via INTRAVENOUS
  Filled 2016-09-30 (×8): qty 1000

## 2016-09-30 MED ORDER — LIDOCAINE 2% (20 MG/ML) 5 ML SYRINGE
INTRAMUSCULAR | Status: AC
  Filled 2016-09-30: qty 5

## 2016-09-30 MED ORDER — EPHEDRINE SULFATE 50 MG/ML IJ SOLN
INTRAMUSCULAR | Status: DC | PRN
  Administered 2016-09-30 (×2): 5 mg via INTRAVENOUS
  Administered 2016-09-30 (×2): 10 mg via INTRAVENOUS

## 2016-09-30 MED ORDER — MORPHINE SULFATE (PF) 4 MG/ML IV SOLN
1.0000 mg | INTRAVENOUS | Status: DC | PRN
  Administered 2016-09-30: 2 mg via INTRAVENOUS
  Administered 2016-09-30 – 2016-10-01 (×2): 1 mg via INTRAVENOUS
  Administered 2016-10-01 (×2): 3 mg via INTRAVENOUS
  Administered 2016-10-01: 2 mg via INTRAVENOUS
  Filled 2016-09-30 (×7): qty 1

## 2016-09-30 MED ORDER — SUGAMMADEX SODIUM 500 MG/5ML IV SOLN
INTRAVENOUS | Status: DC | PRN
  Administered 2016-09-30: 300 mg via INTRAVENOUS

## 2016-09-30 MED ORDER — DEXAMETHASONE SODIUM PHOSPHATE 10 MG/ML IJ SOLN
INTRAMUSCULAR | Status: AC
  Filled 2016-09-30: qty 1

## 2016-09-30 MED ORDER — MIDAZOLAM HCL 5 MG/5ML IJ SOLN
INTRAMUSCULAR | Status: DC | PRN
  Administered 2016-09-30: 1 mg via INTRAVENOUS

## 2016-09-30 MED ORDER — SUGAMMADEX SODIUM 500 MG/5ML IV SOLN
INTRAVENOUS | Status: AC
  Filled 2016-09-30: qty 5

## 2016-09-30 MED ORDER — FENTANYL CITRATE (PF) 100 MCG/2ML IJ SOLN
INTRAMUSCULAR | Status: DC | PRN
  Administered 2016-09-30 (×4): 50 ug via INTRAVENOUS

## 2016-09-30 MED ORDER — LIDOCAINE 2% (20 MG/ML) 5 ML SYRINGE
INTRAMUSCULAR | Status: DC | PRN
  Administered 2016-09-30: 1.5 mg/kg/h via INTRAVENOUS

## 2016-09-30 MED ORDER — PROMETHAZINE HCL 25 MG/ML IJ SOLN
INTRAMUSCULAR | Status: AC
  Filled 2016-09-30: qty 1

## 2016-09-30 MED ORDER — SUCCINYLCHOLINE CHLORIDE 200 MG/10ML IV SOSY
PREFILLED_SYRINGE | INTRAVENOUS | Status: DC | PRN
  Administered 2016-09-30: 120 mg via INTRAVENOUS

## 2016-09-30 MED ORDER — MIDAZOLAM HCL 2 MG/2ML IJ SOLN
INTRAMUSCULAR | Status: AC
  Filled 2016-09-30: qty 2

## 2016-09-30 MED ORDER — ONDANSETRON HCL 4 MG/2ML IJ SOLN: 4.0000 mg | INTRAMUSCULAR | Status: DC | PRN

## 2016-09-30 MED ORDER — FENTANYL CITRATE (PF) 250 MCG/5ML IJ SOLN
INTRAMUSCULAR | Status: AC
  Filled 2016-09-30: qty 5

## 2016-09-30 MED ORDER — EPHEDRINE 5 MG/ML INJ
INTRAVENOUS | Status: AC
  Filled 2016-09-30: qty 10

## 2016-09-30 MED ORDER — OXYCODONE HCL 5 MG/5ML PO SOLN
5.0000 mg | Freq: Once | ORAL | Status: DC | PRN
  Filled 2016-09-30: qty 5

## 2016-09-30 MED ORDER — HYDROMORPHONE HCL 1 MG/ML IJ SOLN
0.2500 mg | INTRAMUSCULAR | Status: DC | PRN
  Administered 2016-09-30 (×3): 0.5 mg via INTRAVENOUS

## 2016-09-30 MED ORDER — PANTOPRAZOLE SODIUM 40 MG IV SOLR
40.0000 mg | Freq: Every day | INTRAVENOUS | Status: DC
  Administered 2016-09-30 – 2016-10-02 (×3): 40 mg via INTRAVENOUS
  Filled 2016-09-30 (×3): qty 40

## 2016-09-30 MED ORDER — DEXAMETHASONE SODIUM PHOSPHATE 4 MG/ML IJ SOLN
4.0000 mg | INTRAMUSCULAR | Status: DC
  Filled 2016-09-30: qty 1

## 2016-09-30 MED ORDER — OXYCODONE HCL 5 MG/5ML PO SOLN
5.0000 mg | ORAL | Status: DC | PRN
Start: 2016-10-01 — End: 2016-10-03
  Administered 2016-10-02 – 2016-10-03 (×3): 5 mg via ORAL
  Filled 2016-09-30 (×4): qty 5

## 2016-09-30 MED ORDER — ONDANSETRON HCL 4 MG/2ML IJ SOLN
INTRAMUSCULAR | Status: AC
  Filled 2016-09-30: qty 2

## 2016-09-30 MED ORDER — HEPARIN SODIUM (PORCINE) 5000 UNIT/ML IJ SOLN
5000.0000 [IU] | INTRAMUSCULAR | Status: AC
  Administered 2016-09-30: 5000 [IU] via SUBCUTANEOUS
  Filled 2016-09-30: qty 1

## 2016-09-30 MED ORDER — INSULIN ASPART 100 UNIT/ML ~~LOC~~ SOLN
0.0000 [IU] | SUBCUTANEOUS | Status: DC
  Administered 2016-09-30 (×2): 7 [IU] via SUBCUTANEOUS
  Administered 2016-10-01 (×5): 3 [IU] via SUBCUTANEOUS
  Administered 2016-10-01: 4 [IU] via SUBCUTANEOUS
  Administered 2016-10-02: 3 [IU] via SUBCUTANEOUS
  Administered 2016-10-02: 4 [IU] via SUBCUTANEOUS
  Administered 2016-10-02 – 2016-10-03 (×2): 3 [IU] via SUBCUTANEOUS

## 2016-09-30 MED ORDER — ONDANSETRON HCL 4 MG/2ML IJ SOLN
INTRAMUSCULAR | Status: DC | PRN
  Administered 2016-09-30: 4 mg via INTRAVENOUS

## 2016-09-30 SURGICAL SUPPLY — 84 items
ADH SKN CLS APL DERMABOND .7 (GAUZE/BANDAGES/DRESSINGS) ×1
APL SKNCLS STERI-STRIP NONHPOA (GAUZE/BANDAGES/DRESSINGS)
APL SRG 32X5 SNPLK LF DISP (MISCELLANEOUS) ×1
APPLICATOR COTTON TIP 6IN STRL (MISCELLANEOUS) ×4 IMPLANT
APPLIER CLIP ROT 10 11.4 M/L (STAPLE)
APPLIER CLIP ROT 13.4 12 LRG (CLIP)
APR CLP LRG 13.4X12 ROT 20 MLT (CLIP)
APR CLP MED LRG 11.4X10 (STAPLE)
BENZOIN TINCTURE PRP APPL 2/3 (GAUZE/BANDAGES/DRESSINGS) IMPLANT
BLADE SURG 15 STRL LF DISP TIS (BLADE) ×1 IMPLANT
BLADE SURG 15 STRL SS (BLADE) ×3
CABLE HIGH FREQUENCY MONO STRZ (ELECTRODE) IMPLANT
CLIP APPLIE ROT 10 11.4 M/L (STAPLE) IMPLANT
CLIP APPLIE ROT 13.4 12 LRG (CLIP) IMPLANT
CLIP SUT LAPRA TY ABSORB (SUTURE) ×3 IMPLANT
CLOSURE WOUND 1/2 X4 (GAUZE/BANDAGES/DRESSINGS)
COVER SURGICAL LIGHT HANDLE (MISCELLANEOUS) ×3 IMPLANT
DERMABOND ADVANCED (GAUZE/BANDAGES/DRESSINGS) ×2
DERMABOND ADVANCED .7 DNX12 (GAUZE/BANDAGES/DRESSINGS) IMPLANT
DEVICE SUT QUICK LOAD TK 5 (STAPLE) IMPLANT
DEVICE SUT TI-KNOT TK 5X26 (MISCELLANEOUS) IMPLANT
DEVICE SUTURE ENDOST 10MM (ENDOMECHANICALS) ×3 IMPLANT
DEVICE TI KNOT TK5 (MISCELLANEOUS)
DISSECTOR BLUNT TIP ENDO 5MM (MISCELLANEOUS) IMPLANT
DRAIN CHANNEL 19F RND (DRAIN) ×2 IMPLANT
DRAIN PENROSE 18X1/4 LTX STRL (WOUND CARE) ×3 IMPLANT
EVACUATOR SILICONE 100CC (DRAIN) ×2 IMPLANT
GAUZE SPONGE 4X4 12PLY STRL (GAUZE/BANDAGES/DRESSINGS) IMPLANT
GAUZE SPONGE 4X4 16PLY XRAY LF (GAUZE/BANDAGES/DRESSINGS) ×3 IMPLANT
GLOVE BIOGEL M 8.0 STRL (GLOVE) ×3 IMPLANT
GOWN STRL REUS W/TWL XL LVL3 (GOWN DISPOSABLE) ×12 IMPLANT
HANDLE STAPLE EGIA 4 XL (STAPLE) ×3 IMPLANT
HOVERMATT SINGLE USE (MISCELLANEOUS) ×3 IMPLANT
KIT BASIN OR (CUSTOM PROCEDURE TRAY) ×3 IMPLANT
KIT GASTRIC LAVAGE 34FR ADT (SET/KITS/TRAYS/PACK) ×3 IMPLANT
MARKER SKIN DUAL TIP RULER LAB (MISCELLANEOUS) ×3 IMPLANT
NDL SPNL 22GX3.5 QUINCKE BK (NEEDLE) ×1 IMPLANT
NEEDLE SPNL 22GX3.5 QUINCKE BK (NEEDLE) ×3 IMPLANT
PACK CARDIOVASCULAR III (CUSTOM PROCEDURE TRAY) ×3 IMPLANT
QUICK LOAD TK 5 (STAPLE)
RELOAD EGIA 45 MED/THCK PURPLE (STAPLE) ×2 IMPLANT
RELOAD EGIA 45 TAN VASC (STAPLE) ×2 IMPLANT
RELOAD EGIA 60 MED/THCK PURPLE (STAPLE) ×3 IMPLANT
RELOAD EGIA 60 TAN VASC (STAPLE) ×4 IMPLANT
RELOAD ENDO STITCH 2.0 (ENDOMECHANICALS) ×36
RELOAD STAPLE 45 PURP MED/THCK (STAPLE) IMPLANT
RELOAD STAPLE 60 MED/THCK ART (STAPLE) IMPLANT
RELOAD SUT SNGL STCH ABSRB 2-0 (ENDOMECHANICALS) ×5 IMPLANT
RELOAD SUT SNGL STCH BLK 2-0 (ENDOMECHANICALS) ×4 IMPLANT
RELOAD TRI 45 ART MED THCK PUR (STAPLE) IMPLANT
RELOAD TRI 60 ART MED THCK PUR (STAPLE) ×4 IMPLANT
SCISSORS LAP 5X45 EPIX DISP (ENDOMECHANICALS) ×3 IMPLANT
SEALANT SURGICAL APPL DUAL CAN (MISCELLANEOUS) ×3 IMPLANT
SET IRRIG TUBING LAPAROSCOPIC (IRRIGATION / IRRIGATOR) ×2 IMPLANT
SHEARS HARMONIC ACE PLUS 45CM (MISCELLANEOUS) ×3 IMPLANT
SLEEVE ADV FIXATION 12X100MM (TROCAR) ×6 IMPLANT
SLEEVE ADV FIXATION 5X100MM (TROCAR) IMPLANT
SOLUTION ANTI FOG 6CC (MISCELLANEOUS) ×3 IMPLANT
SPONGE DRAIN TRACH 4X4 STRL 2S (GAUZE/BANDAGES/DRESSINGS) ×2 IMPLANT
STAPLER VISISTAT 35W (STAPLE) ×3 IMPLANT
STRIP CLOSURE SKIN 1/2X4 (GAUZE/BANDAGES/DRESSINGS) IMPLANT
SUT ETHILON 2 0 PS N (SUTURE) ×2 IMPLANT
SUT RELOAD ENDO STITCH 2 48X1 (ENDOMECHANICALS) ×8
SUT RELOAD ENDO STITCH 2.0 (ENDOMECHANICALS) ×4
SUT SURGIDAC NAB ES-9 0 48 120 (SUTURE) IMPLANT
SUT VIC AB 2-0 SH 27 (SUTURE) ×6
SUT VIC AB 2-0 SH 27X BRD (SUTURE) ×1 IMPLANT
SUT VIC AB 4-0 SH 18 (SUTURE) ×3 IMPLANT
SUTURE RELOAD END STTCH 2 48X1 (ENDOMECHANICALS) ×8 IMPLANT
SUTURE RELOAD ENDO STITCH 2.0 (ENDOMECHANICALS) ×4 IMPLANT
SYR 10ML ECCENTRIC (SYRINGE) ×3 IMPLANT
SYR 20CC LL (SYRINGE) ×6 IMPLANT
SYR 50ML LL SCALE MARK (SYRINGE) ×3 IMPLANT
TOWEL OR 17X26 10 PK STRL BLUE (TOWEL DISPOSABLE) ×6 IMPLANT
TOWEL OR NON WOVEN STRL DISP B (DISPOSABLE) ×3 IMPLANT
TRAY FOLEY CATH SILVER 14FR (SET/KITS/TRAYS/PACK) ×2 IMPLANT
TROCAR ADV FIXATION 12X100MM (TROCAR) ×3 IMPLANT
TROCAR ADV FIXATION 5X100MM (TROCAR) ×3 IMPLANT
TROCAR BLADELESS OPT 5 100 (ENDOMECHANICALS) ×3 IMPLANT
TROCAR XCEL 12X100 BLDLESS (ENDOMECHANICALS) ×3 IMPLANT
TUBING CONNECTING 10 (TUBING) ×2 IMPLANT
TUBING CONNECTING 10' (TUBING) ×1
TUBING ENDO SMARTCAP PENTAX (MISCELLANEOUS) ×3 IMPLANT
TUBING INSUF HEATED (TUBING) ×3 IMPLANT

## 2016-09-30 NOTE — Anesthesia Preprocedure Evaluation (Signed)
Anesthesia Evaluation  Patient identified by MRN, date of birth, ID band Patient awake    Reviewed: Allergy & Precautions, NPO status , Patient's Chart, lab work & pertinent test results  History of Anesthesia Complications (+) PONV and history of anesthetic complications  Airway Mallampati: II  TM Distance: >3 FB Neck ROM: Full    Dental no notable dental hx.    Pulmonary neg pulmonary ROS,    Pulmonary exam normal breath sounds clear to auscultation       Cardiovascular hypertension, Pt. on medications negative cardio ROS Normal cardiovascular exam Rhythm:Regular Rate:Normal     Neuro/Psych PSYCHIATRIC DISORDERS Depression negative neurological ROS  negative psych ROS   GI/Hepatic negative GI ROS, Neg liver ROS, GERD  ,  Endo/Other  negative endocrine ROSdiabetes, Type 2, Oral Hypoglycemic Agents  Renal/GU negative Renal ROS  negative genitourinary   Musculoskeletal negative musculoskeletal ROS (+)   Abdominal (+) + obese,   Peds negative pediatric ROS (+)  Hematology negative hematology ROS (+)   Anesthesia Other Findings   Reproductive/Obstetrics negative OB ROS                             Anesthesia Physical Anesthesia Plan  ASA: III  Anesthesia Plan: General   Post-op Pain Management:    Induction: Intravenous  Airway Management Planned: Oral ETT  Additional Equipment:   Intra-op Plan:   Post-operative Plan: Extubation in OR  Informed Consent: I have reviewed the patients History and Physical, chart, labs and discussed the procedure including the risks, benefits and alternatives for the proposed anesthesia with the patient or authorized representative who has indicated his/her understanding and acceptance.   Dental advisory given  Plan Discussed with: CRNA  Anesthesia Plan Comments:         Anesthesia Quick Evaluation

## 2016-09-30 NOTE — Anesthesia Postprocedure Evaluation (Signed)
Anesthesia Post Note  Patient: Lindsey Sanders  Procedure(s) Performed: Procedure(s) (LRB): LAPAROSCOPIC ROUX-EN-Y GASTRIC BYPASS WITH UPPER ENDOSCOPY (N/A)  Patient location during evaluation: PACU Anesthesia Type: General Level of consciousness: awake and alert Pain management: pain level controlled Vital Signs Assessment: post-procedure vital signs reviewed and stable Respiratory status: spontaneous breathing, nonlabored ventilation, respiratory function stable and patient connected to nasal cannula oxygen Cardiovascular status: blood pressure returned to baseline and stable Postop Assessment: no signs of nausea or vomiting Anesthetic complications: no       Last Vitals:  Vitals:   09/30/16 1220 09/30/16 1230  BP:  (!) 160/82  Pulse: 89 88  Resp: 19 18  Temp:      Last Pain:  Vitals:   09/30/16 1220  TempSrc:   PainSc: 3                  Lynda [place]

## 2016-09-30 NOTE — Anesthesia Procedure Notes (Signed)
Procedure Name: Intubation Date/Time: 09/30/2016 7:39 AM Performed by: Maxwell Caul Pre-anesthesia Checklist: Patient identified, Emergency Drugs available, Suction available and Patient being monitored Patient Re-evaluated:Patient Re-evaluated prior to inductionOxygen Delivery Method: Circle system utilized Preoxygenation: Pre-oxygenation with 100% oxygen Intubation Type: IV induction Ventilation: Mask ventilation without difficulty and Oral airway inserted - appropriate to patient size Laryngoscope Size: Mac and 4 Grade View: Grade I Tube type: Oral Tube size: 7.0 mm Number of attempts: 1 Airway Equipment and Method: Stylet and Oral airway Placement Confirmation: ETT inserted through vocal cords under direct vision,  positive ETCO2 and breath sounds checked- equal and bilateral Secured at: 21 cm Tube secured with: Tape Dental Injury: Teeth and Oropharynx as per pre-operative assessment

## 2016-09-30 NOTE — Op Note (Signed)
Name:  Lindsey Sanders MRN: [id number] Date of Surgery: 09/30/2016  Preop Diagnosis:  Morbid Obesity, S/P RYGB  Postop Diagnosis:  Morbid Obesity, S/P RYGB (Weight - 282, BMI - 49.1)  Procedure:  Upper endoscopy  (Intraoperative)  Surgeon:  Alphonsa Overall, M.D.  Anesthesia:  GET  Indications for procedure: Lindsey Sanders is a 71 y.o. female whose primary care physician is [facility], MD and has completed a Roux-en-Y gastric bypass today by Dr. Hassell Done.  I am doing an intraoperative upper endoscopy to evaluate the gastric pouch and the gastro-jejunal anastomosis.  Operative Note: The patient is under general anesthesia.  Dr. Hassell Done is laparoscoping the patient while I do an upper endoscopy to evaluate the stomach pouch and gastrojejunal anastomosis.  With the patient intubated, I passed the Pentax endoscope without difficulty down the esophagus.  The esophago-gastric junction was at 41 cm.  The gastro-jejunal anastomosis was at 48 cm.  The mucosa of the stomach looked viable and the staple line was intact without bleeding.  The gastro-jejunal anastomosis looked okay.  While I insufflated the stomach pouch with air, Dr. Hassell Done clamped off the efferent limb of the jejunum.  He then flooded the upper abdomen with saline to put the gastric pouch and gastro-jejunal anastomosis under saline.  There was bubbling anteriorly on the gastrojejunal anastomosis.  Dr. Hassell Done placed two sutures, but there was still bubbling.  I was able to advance the endoscope through the anastomosis.  There was no evidence of problems from the endoscopic view of the gastrojejunal anastomosis.  The scope was then withdrawn.  The esophagus was unremarkable and the patient tolerated the endoscopy without difficulty.  Alphonsa Overall, MD, [facility] Pager: [phone] Office phone:  [phone]

## 2016-09-30 NOTE — Progress Notes (Signed)
Patient states she does not use CPAP at home, but was told after her sleep study that she should use BiPAP. Placed on nocturnal auto-titration per RT protocol.

## 2016-09-30 NOTE — Progress Notes (Signed)
Pt refused cpap

## 2016-09-30 NOTE — Transfer of Care (Signed)
Immediate Anesthesia Transfer of Care Note  Patient: Lindsey Sanders  Procedure(s) Performed: Procedure(s): LAPAROSCOPIC ROUX-EN-Y GASTRIC BYPASS WITH UPPER ENDOSCOPY (N/A)  Patient Location: PACU  Anesthesia Type:General  Level of Consciousness:  sedated, patient cooperative and responds to stimulation  Airway & Oxygen Therapy:Patient Spontanous Breathing and Patient connected to face mask oxgen  Post-op Assessment:  Report given to PACU RN and Post -op Vital signs reviewed and stable  Post vital signs:  Reviewed and stable  Last Vitals:  Vitals:   09/30/16 0559  BP: (!) 154/79  Pulse: 89  Resp: 16  Temp: 13.6 C    Complications: No apparent anesthesia complications

## 2016-09-30 NOTE — Op Note (Signed)
Surgeon: Althea Grimmer. Hassell Done, MD, FACS Asst:  Alphonsa Overall, MD,FACS Anesthesia: General endotracheal Drains:  60 Blake in the upper abdomen  Procedure: Laparoscopic Roux en Y gastric bypass with 40 cm BP limb and 100 cm Roux limb, antecolic, antegastric, candy cane to the left.  Closure of Peterson's defect. Upper endoscopy.   Description of Procedure:  The patient was taken to OR 1 at [facility] and given general anesthesia.  The abdomen was prepped with PCMX and draped sterilely.  A time out was performed.    The operation began by identifying the ligament of Treitz. I measured 40 cm downstream and divided the bowel with a 6 cm Covidian stapler.  I sutured a Penrose drain along the Roux limb end.  I measured a 1 meter (100 cm) Roux limb and then placed the distal bowels to the BP limb side by side and performed a stapled jejunojejunostomy. The common defect was closed from either end with 4-0 Vicryl using the Endo Stitch. The mesenteric defect was closed with a running 2-0 silk using the Endo Stitch. Tisseel was applied to the suture line.  The omentum was divided with the harmonic scalpel.  This was taken down to the transverse colon.  The Harmonic left a whitish scar on the colon so I oversewed this with a single U suture of 0 vicryl.  There was no enterotomy noted.  The Nathanson retractor was inserted in the left lateral segment of liver was retracted. The foregut dissection ensued.  About 6 cm down along the lessor curvature I dissected in to the retrogastric space.  The pouch was created with multiple firings of the purple load 6 cm Covidien staples the first two without TRS and TRS thereafter.    The Roux limb was then brought up with the candycane pointed left and a back row of sutures of 2-0 Vicryl were placed. I opened along the right side of each structure and inserted the 4.5 cm stapler to create the gastrojejunostomy. The common defect was closed from either end with 2-0 Vicryl and a second row was  placed anterior to that the Ewald tube acting as a stent across the anastomosis. The Penrose drain was removed. Peterson's defect was closed with 2-0 silk.   Endoscopy was performed by Dr. Lucia Gaskins.  Bubbles were seen in the anterior suture line and this was reinforced with a single U stitch and a running vicryl with laparoties.  After Tisseal placement, I placed a 19 Blake drain above the anastomosis and brought out through the left upper quadrant.    The incisions were injected with Exparel and were closed with 4-0 Vicryl and Dermabond.    The patient was taken to the recovery room in satisfactory condition.  Matt B. Hassell Done, MD, FACS

## 2016-09-30 NOTE — Discharge Instructions (Signed)

## 2016-09-30 NOTE — Interval H&P Note (Signed)
History and Physical Interval Note:  09/30/2016 7:13 AM  Lindsey Sanders  has presented today for surgery, with the diagnosis of Morbid Obesity, Diabetes, OSA, HTN  The various methods of treatment have been discussed with the patient and family. After consideration of risks, benefits and other options for treatment, the patient has consented to  Procedure(s): LAPAROSCOPIC ROUX-EN-Y GASTRIC BYPASS WITH UPPER ENDOSCOPY (N/A) as a surgical intervention .  The patient's history has been reviewed, patient examined, no change in status, stable for surgery.  I have reviewed the patient's chart and labs.  Questions were answered to the patient's satisfaction.     Charlyne Robertshaw B

## 2016-10-01 ENCOUNTER — Inpatient Hospital Stay (HOSPITAL_COMMUNITY): Payer: BC Managed Care – PPO

## 2016-10-01 LAB — CBC WITH DIFFERENTIAL/PLATELET
BASOS ABS: 0 10*3/uL (ref 0.0–0.1)
Basophils Relative: 0 %
Eosinophils Absolute: 0 10*3/uL (ref 0.0–0.7)
Eosinophils Relative: 0 %
HEMATOCRIT: 36.7 % (ref 36.0–46.0)
Hemoglobin: 11.4 g/dL — ABNORMAL LOW (ref 12.0–15.0)
Lymphocytes Relative: 10 %
Lymphs Abs: 1 10*3/uL (ref 0.7–4.0)
MCH: 27.4 pg (ref 26.0–34.0)
MCHC: 31.1 g/dL (ref 30.0–36.0)
MCV: 88.2 fL (ref 78.0–100.0)
MONO ABS: 0.6 10*3/uL (ref 0.1–1.0)
Monocytes Relative: 6 %
NEUTROS ABS: 9 10*3/uL — AB (ref 1.7–7.7)
Neutrophils Relative %: 84 %
Platelets: 171 10*3/uL (ref 150–400)
RBC: 4.16 MIL/uL (ref 3.87–5.11)
RDW: 13.8 % (ref 11.5–15.5)
WBC: 10.7 10*3/uL — ABNORMAL HIGH (ref 4.0–10.5)

## 2016-10-01 LAB — GLUCOSE, CAPILLARY
GLUCOSE-CAPILLARY: 132 mg/dL — AB (ref 65–99)
GLUCOSE-CAPILLARY: 151 mg/dL — AB (ref 65–99)
GLUCOSE-CAPILLARY: 162 mg/dL — AB (ref 65–99)
Glucose-Capillary: 149 mg/dL — ABNORMAL HIGH (ref 65–99)
Glucose-Capillary: 146 mg/dL — ABNORMAL HIGH (ref 65–99)
Glucose-Capillary: 136 mg/dL — ABNORMAL HIGH (ref 65–99)
Glucose-Capillary: 127 mg/dL — ABNORMAL HIGH (ref 65–99)

## 2016-10-01 MED ORDER — IOPAMIDOL (ISOVUE-300) INJECTION 61%
INTRAVENOUS | Status: AC
  Filled 2016-10-01: qty 50

## 2016-10-01 MED ORDER — LIP MEDEX EX OINT
TOPICAL_OINTMENT | CUTANEOUS | Status: AC
  Filled 2016-10-01: qty 7

## 2016-10-01 NOTE — Progress Notes (Signed)
Patient with slow oral intake.  Encouraged patient to sip on fluids.  Provided with 2 ounces of clear fluid.  Patient tearful and states she is in  pain.  Bedside RN alerted and together we formed pain management plan for patient.  Encouraged patient to continue with ambulation to decrease gas pain and continue the use of her IS.  Patient states understanding.

## 2016-10-01 NOTE — Plan of Care (Signed)
Problem: Food- and Nutrition-Related Knowledge Deficit (NB-1.1) Goal: Nutrition education Formal process to instruct or train a patient/client in a skill or to impart knowledge to help patients/clients voluntarily manage or modify food choices and eating behavior to maintain or improve health. Outcome: Completed/Met Date Met: 10/01/16 Nutrition Education Note  Received consult for diet education per DROP protocol.   Discussed 2 week post op diet with pt. Emphasized that liquids must be non carbonated, non caffeinated, and sugar free. Fluid goals discussed. Pt to follow up with outpatient bariatric RD for further diet progression after 2 weeks. Multivitamins and minerals also reviewed. Teach back method used, pt expressed understanding, expect good compliance.   Diet: First 2 Weeks  You will see the nutritionist about two (2) weeks after your surgery. The nutritionist will increase the types of foods you can eat if you are handling liquids well:  If you have severe vomiting or nausea and cannot handle clear liquids lasting longer than 1 day, call your surgeon  Protein Shake  Drink at least 2 ounces of shake 5-6 times per day  Each serving of protein shakes (usually 8 - 12 ounces) should have a minimum of:  15 grams of protein  And no more than 5 grams of carbohydrate  Goal for protein each day:  Men = 80 grams per day  Women = 60 grams per day  Protein powder may be added to fluids such as non-fat milk or Lactaid milk or Soy milk (limit to 35 grams added protein powder per serving)   Hydration  Slowly increase the amount of water and other clear liquids as tolerated (See Acceptable Fluids)  Slowly increase the amount of protein shake as tolerated  Sip fluids slowly and throughout the day  May use sugar substitutes in small amounts (no more than 6 - 8 packets per day; i.e. Splenda)   Fluid Goal  The first goal is to drink at least 8 ounces of protein shake/drink per day (or as directed  by the nutritionist); some examples of protein shakes are Premier Protein, [employer], [employer]R [employer], EAS Edge HP, and Unjury. See handout from pre-op Bariatric Education Class:  Slowly increase the amount of protein shake you drink as tolerated  You may find it easier to slowly sip shakes throughout the day  It is important to get your proteins in first  Your fluid goal is to drink 64 - 100 ounces of fluid daily  It may take a few weeks to build up to this  32 oz (or more) should be clear liquids  And  32 oz (or more) should be full liquids (see below for examples)  Liquids should not contain sugar, caffeine, or carbonation   Clear Liquids:  Water or Sugar-free flavored water (i.e. Fruit H2O, Propel)  Decaffeinated coffee or tea (sugar-free)  Crystal Lite, Wyler's Lite, Minute Maid Lite  Sugar-free Jell-O  Bouillon or broth  Sugar-free Popsicle: *Less than 20 calories each; Limit 1 per day   Full Liquids:  Protein Shakes/Drinks + 2 choices per day of other full liquids  Full liquids must be:  No More Than 12 grams of Carbs per serving  No More Than 3 grams of Fat per serving  Strained low-fat cream soup  Non-Fat milk  Fat-free Lactaid Milk  Sugar-free yogurt (Dannon Lite & Fit, [place] yogurt, Oikos Zero)   Clayton Bibles, MS, RD, LDN Pager: [phone] After Hours Pager: [phone]

## 2016-10-01 NOTE — Progress Notes (Signed)
Dr Hassell Done made aware of UGI results.  Diet increased to ice and water with slow progression.  Bedside RN made aware of the above.

## 2016-10-01 NOTE — Progress Notes (Signed)
Patient alert and oriented, Post op day 1.  Provided support and encouragement.  Encouraged pulmonary toilet and ambulation.  Await UGI results.  All questions answered.  Will continue to monitor.

## 2016-10-02 LAB — CBC WITH DIFFERENTIAL/PLATELET
BASOS ABS: 0 10*3/uL (ref 0.0–0.1)
Basophils Relative: 0 %
Eosinophils Absolute: 0.1 10*3/uL (ref 0.0–0.7)
Eosinophils Relative: 1 %
HCT: 32.7 % — ABNORMAL LOW (ref 36.0–46.0)
Hemoglobin: 10.4 g/dL — ABNORMAL LOW (ref 12.0–15.0)
LYMPHS ABS: 1.1 10*3/uL (ref 0.7–4.0)
LYMPHS PCT: 13 %
MCH: 28.8 pg (ref 26.0–34.0)
MCHC: 31.8 g/dL (ref 30.0–36.0)
MCV: 90.6 fL (ref 78.0–100.0)
MONOS PCT: 9 %
Monocytes Absolute: 0.8 10*3/uL (ref 0.1–1.0)
NEUTROS ABS: 6.9 10*3/uL (ref 1.7–7.7)
Neutrophils Relative %: 77 %
PLATELETS: 136 10*3/uL — AB (ref 150–400)
RBC: 3.61 MIL/uL — ABNORMAL LOW (ref 3.87–5.11)
RDW: 14.1 % (ref 11.5–15.5)
WBC: 9 10*3/uL (ref 4.0–10.5)

## 2016-10-02 LAB — GLUCOSE, CAPILLARY
GLUCOSE-CAPILLARY: 120 mg/dL — AB (ref 65–99)
GLUCOSE-CAPILLARY: 120 mg/dL — AB (ref 65–99)
Glucose-Capillary: 137 mg/dL — ABNORMAL HIGH (ref 65–99)
Glucose-Capillary: 128 mg/dL — ABNORMAL HIGH (ref 65–99)

## 2016-10-02 NOTE — Progress Notes (Signed)
Patient ID: Lindsey Sanders, female   DOB: [birth year], 71 y.o.   MRN: [id number] [facility] Surgery Progress Note:   2 Days Post-Op  Subjective: Mental status is clear.  Sitting up in chair Objective: Vital signs in last 24 hours: Temp:  [98.4 F (36.9 C)-99.8 F (37.7 C)] 98.5 F (36.9 C) (03/28 0621) Pulse Rate:  [65-87] 69 (03/28 0621) Resp:  [16-18] 18 (03/28 0621) BP: (106-151)/(56-87) 142/70 (03/28 0621) SpO2:  [92 %-100 %] 96 % (03/28 0621)  Intake/Output from previous day: 03/27 0701 - 03/28 0700 In: 1880 [P.O.:480; I.V.:1400] Out: 755 [Urine:600; Drains:155] Intake/Output this shift: Total I/O In: -  Out: 620 [Urine:600; Drains:20]  Physical Exam: Work of breathing is normal.  JP serosanguinous.  Having upper abdominal discomfort.    Lab Results:  Results for orders placed or performed during the hospital encounter of 09/30/16 (from the past 48 hour(s))  Glucose, capillary     Status: Abnormal   Collection Time: 09/30/16 11:37 AM  Result Value Ref Range   Glucose-Capillary 229 (H) 65 - 99 mg/dL  CBC     Status: Abnormal   Collection Time: 09/30/16  3:16 PM  Result Value Ref Range   WBC 13.0 (H) 4.0 - 10.5 K/uL   RBC 4.30 3.87 - 5.11 MIL/uL   Hemoglobin 11.9 (L) 12.0 - 15.0 g/dL   HCT 38.0 36.0 - 46.0 %   MCV 88.4 78.0 - 100.0 fL   MCH 27.7 26.0 - 34.0 pg   MCHC 31.3 30.0 - 36.0 g/dL   RDW 13.7 11.5 - 15.5 %   Platelets 194 150 - 400 K/uL  Creatinine, serum     Status: Abnormal   Collection Time: 09/30/16  3:16 PM  Result Value Ref Range   Creatinine, Ser 0.97 0.44 - 1.00 mg/dL   GFR calc non Af Amer 57 (L) >60 mL/min   GFR calc Af Amer >60 >60 mL/min    Comment: (NOTE) The eGFR has been calculated using the CKD EPI equation. This calculation has not been validated in all clinical situations. eGFR's persistently <60 mL/min signify possible Chronic Kidney Disease.   Glucose, capillary     Status: Abnormal   Collection Time: 09/30/16  4:35 PM   Result Value Ref Range   Glucose-Capillary 219 (H) 65 - 99 mg/dL  Glucose, capillary     Status: Abnormal   Collection Time: 09/30/16  7:58 PM  Result Value Ref Range   Glucose-Capillary 204 (H) 65 - 99 mg/dL  Glucose, capillary     Status: Abnormal   Collection Time: 10/01/16 12:14 AM  Result Value Ref Range   Glucose-Capillary 162 (H) 65 - 99 mg/dL  Glucose, capillary     Status: Abnormal   Collection Time: 10/01/16  3:43 AM  Result Value Ref Range   Glucose-Capillary 149 (H) 65 - 99 mg/dL  CBC WITH DIFFERENTIAL     Status: Abnormal   Collection Time: 10/01/16  4:29 AM  Result Value Ref Range   WBC 10.7 (H) 4.0 - 10.5 K/uL   RBC 4.16 3.87 - 5.11 MIL/uL   Hemoglobin 11.4 (L) 12.0 - 15.0 g/dL   HCT 36.7 36.0 - 46.0 %   MCV 88.2 78.0 - 100.0 fL   MCH 27.4 26.0 - 34.0 pg   MCHC 31.1 30.0 - 36.0 g/dL   RDW 13.8 11.5 - 15.5 %   Platelets 171 150 - 400 K/uL   Neutrophils Relative % 84 %   Neutro Abs 9.0 (H)  1.7 - 7.7 K/uL   Lymphocytes Relative 10 %   Lymphs Abs 1.0 0.7 - 4.0 K/uL   Monocytes Relative 6 %   Monocytes Absolute 0.6 0.1 - 1.0 K/uL   Eosinophils Relative 0 %   Eosinophils Absolute 0.0 0.0 - 0.7 K/uL   Basophils Relative 0 %   Basophils Absolute 0.0 0.0 - 0.1 K/uL  Glucose, capillary     Status: Abnormal   Collection Time: 10/01/16  7:37 AM  Result Value Ref Range   Glucose-Capillary 146 (H) 65 - 99 mg/dL  Glucose, capillary     Status: Abnormal   Collection Time: 10/01/16 12:04 PM  Result Value Ref Range   Glucose-Capillary 136 (H) 65 - 99 mg/dL  Glucose, capillary     Status: Abnormal   Collection Time: 10/01/16  3:30 PM  Result Value Ref Range   Glucose-Capillary 132 (H) 65 - 99 mg/dL  Glucose, capillary     Status: Abnormal   Collection Time: 10/01/16  7:49 PM  Result Value Ref Range   Glucose-Capillary 127 (H) 65 - 99 mg/dL  Glucose, capillary     Status: Abnormal   Collection Time: 10/01/16 11:59 PM  Result Value Ref Range   Glucose-Capillary 151  (H) 65 - 99 mg/dL  Glucose, capillary     Status: Abnormal   Collection Time: 10/02/16  3:46 AM  Result Value Ref Range   Glucose-Capillary 137 (H) 65 - 99 mg/dL  CBC with Differential     Status: Abnormal   Collection Time: 10/02/16  4:36 AM  Result Value Ref Range   WBC 9.0 4.0 - 10.5 K/uL   RBC 3.61 (L) 3.87 - 5.11 MIL/uL   Hemoglobin 10.4 (L) 12.0 - 15.0 g/dL   HCT 32.7 (L) 36.0 - 46.0 %   MCV 90.6 78.0 - 100.0 fL   MCH 28.8 26.0 - 34.0 pg   MCHC 31.8 30.0 - 36.0 g/dL   RDW 14.1 11.5 - 15.5 %   Platelets 136 (L) 150 - 400 K/uL   Neutrophils Relative % 77 %   Neutro Abs 6.9 1.7 - 7.7 K/uL   Lymphocytes Relative 13 %   Lymphs Abs 1.1 0.7 - 4.0 K/uL   Monocytes Relative 9 %   Monocytes Absolute 0.8 0.1 - 1.0 K/uL   Eosinophils Relative 1 %   Eosinophils Absolute 0.1 0.0 - 0.7 K/uL   Basophils Relative 0 %   Basophils Absolute 0.0 0.0 - 0.1 K/uL  Glucose, capillary     Status: Abnormal   Collection Time: 10/02/16  7:49 AM  Result Value Ref Range   Glucose-Capillary 120 (H) 65 - 99 mg/dL    Radiology/Results: Dg Ugi W/water Sol Cm  Result Date: 10/01/2016 CLINICAL DATA:  71 year old female 1 day post Roux-en-Y. Initial encounter. EXAM: WATER SOLUBLE UPPER GI SERIES TECHNIQUE: Single-column upper GI series was performed using water soluble contrast. CONTRAST:  50 cc Omnipaque 300. COMPARISON:  03/21/2016 upper GI series. FLUOROSCOPY TIME:  Fluoroscopy Time:  1 minutes and 6 seconds Radiation Exposure Index (if provided by the fluoroscopic device): 100.5 mGy FINDINGS: Scout view reveals surgical drain left upper quadrant with surgical clips/ staples left upper and lower abdomen. Narrowing at the gastric - small bowel anastomosis without leak identified. No delayed transit of ingested material. Minimal reflux with change of patient position. IMPRESSION: Narrowing at the gastric - small bowel anastomosis without leak identified. No delayed transit of ingested material. Minimal reflux  with change of patient position. Electronically Signed  By: Genia Del M.D.   On: 10/01/2016 09:29    Anti-infectives: Anti-infectives    Start     Dose/Rate Route Frequency Ordered Stop   09/30/16 [phone]  cefoTEtan in Dextrose 5% (CEFOTAN) 2-2.08 GM-% IVPB    Comments:  [place]   : cabinet override      09/30/16 8280 09/30/16 0740   09/30/16 0617  cefoTEtan in Dextrose 5% (CEFOTAN) IVPB 2 g     2 g Intravenous On call to O.R. 09/30/16 0617 09/30/16 0740      Assessment/Plan: Problem List: Patient Active Problem List   Diagnosis Date Noted  . 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 Esophageal reflux   . Morbid obesity ([place])   . HLD (hyperlipidemia)   . CAP (community acquired pneumonia) 03/13/2015  . Diabetes mellitus ([place]) 03/13/2015  . Reactive airway disease 03/13/2015  . Respiratory failure, acute ([place]) 03/13/2015    Taking water OK.  Will try to advance today.  Not ready for discharge.   2 Days Post-Op    LOS: 2 days   Matt B. Hassell Done, MD, [facility] [phone] beeper [phone]  10/02/2016 8:36 AM

## 2016-10-02 NOTE — Progress Notes (Signed)
IMPRESSION: Narrowing at the gastric - small bowel anastomosis without leak Identified  Attempted to give patient POD1  Protein  shakes. Refused first 2.  Has protein poured in 80 oz cup on table.  Will encourage to drink.  But is drinking water and tolerating well.  approx 300 cc on this shift.  Patient ambulated in hall  with husband.

## 2016-10-02 NOTE — Progress Notes (Signed)
Patient alert and oriented, Post op day 2.  Provided support and encouragement.  Encouraged pulmonary toilet, ambulation and small sips of liquids.  All questions answered.  Will continue to monitor. 

## 2016-10-03 LAB — GLUCOSE, CAPILLARY
GLUCOSE-CAPILLARY: 114 mg/dL — AB (ref 65–99)
GLUCOSE-CAPILLARY: 109 mg/dL — AB (ref 65–99)
Glucose-Capillary: 123 mg/dL — ABNORMAL HIGH (ref 65–99)
Glucose-Capillary: 104 mg/dL — ABNORMAL HIGH (ref 65–99)
Glucose-Capillary: 106 mg/dL — ABNORMAL HIGH (ref 65–99)

## 2016-10-03 MED ORDER — NYSTATIN-TRIAMCINOLONE 100000-0.1 UNIT/GM-% EX CREA
TOPICAL_CREAM | Freq: Two times a day (BID) | CUTANEOUS | Status: DC
  Administered 2016-10-03: 10:00:00 via TOPICAL
  Filled 2016-10-03: qty 15

## 2016-10-03 NOTE — Progress Notes (Signed)
Patient alert and oriented, Post op day 3 .  Provided support and encouragement.  Encouraged pulmonary toilet, ambulation and small sips of liquids.  JP drain removed per MD order.  Pannus cleaned dried and Nystatin powder applied.  InterDry ordered.  All questions answered.  Will continue to monitor.

## 2016-10-03 NOTE — Progress Notes (Signed)
Patient alert and oriented, pain is controlled. Patient is tolerating fluids, advanced to protein shake today, patient is tolerating well. Reviewed Gastric Bypass discharge instructions with patient and patient is able to articulate understanding. Provided information on BELT program, Support Group and [facility] outpatient pharmacy. All questions answered, will continue to monitor.    

## 2016-10-03 NOTE — Progress Notes (Signed)
Discharge instructions given and all questions answered. Pt verbalized understanding using teach back method. All belongings sent home with patient.

## 2016-10-09 ENCOUNTER — Telehealth (HOSPITAL_COMMUNITY): Payer: Self-pay

## 2016-10-09 NOTE — Telephone Encounter (Signed)
Made discharge phone call to patient per DROP protocol. Asking the following questions.    1. Do you have someone to care for you now that you are home?  Yes, my husband is with me most of the all the time. 2. Are you having pain now that is not relieved by your pain medication?  No, not having any pain  3. Are you able to drink the recommended daily amount of fluids (48 ounces minimum/day) and protein (60-80 grams/day) as prescribed by the dietitian or nutritional counselor?  I'm  drinking about 50oz daily 4. Are you taking the vitamins and minerals as prescribed?  yes 5. Do you have the "on call" number to contact your surgeon if you have a problem or question?  yes 6. Are your incisions free of redness, swelling or drainage? (If steri strips, address that these can fall off, shower as tolerated) One of my incisions was bleeding, I added a bandage and it stopped.  Patient advised to monitor incision an report further bleeding to the MD. 7. Have your bowels moved since your surgery?  If not, are you passing gas?  yes 8. Are you up and walking 3-4 times per day?  yes 9. Were you provided your discharge medications before your surgery or before you were discharged from the hospital and are you taking them without problem?  Yes

## 2016-10-14 NOTE — Discharge Summary (Signed)
Physician Discharge Summary  Patient ID: Lindsey Sanders MRN: [id number] DOB/AGE: Nov 07, 1945 71 y.o.  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  Nausea and pain from drain at first.  Began liquids and advanced until discharge  Consults: none  Significant Diagnostic Studies: UGI    Discharge Exam: Blood pressure 122/70, pulse 61, temperature 98.3 F (36.8 C), temperature source Oral, resp. rate 16, height 5' 3.5" (1.613 m), weight 124.7 kg (275 lb), SpO2 98 %. Incisions OK  Disposition: 01-Home or Self Care  Discharge Instructions    Ambulate hourly while awake    Complete by:  As directed    Call MD for:  difficulty breathing, headache or visual disturbances    Complete by:  As directed    Call MD for:  persistant dizziness or light-headedness    Complete by:  As directed    Call MD for:  persistant nausea and vomiting    Complete by:  As directed    Call MD for:  redness, tenderness, or signs of infection (pain, swelling, redness, odor or green/yellow discharge around incision site)    Complete by:  As directed    Call MD for:  severe uncontrolled pain    Complete by:  As directed    Call MD for:  temperature >101 F    Complete by:  As directed    Diet bariatric full liquid    Complete by:  As directed    Incentive spirometry    Complete by:  As directed    Perform hourly while awake     Allergies as of 10/03/2016      Reactions   Metformin Diarrhea, Other (See Comments)   Stomach cramps   Penicillins Other (See Comments)   Faint, passes out Has patient had a PCN reaction causing immediate rash, facial/tongue/throat swelling, SOB or lightheadedness with hypotension:No Has patient had a PCN reaction causing severe rash  involving mucus membranes or skin necrosis:No Has patient had a PCN reaction that required hospitalization:No Has patient had a PCN reaction occurring within the last 10 years:No If all of the above answers are "NO", then may proceed with Cephalosporin use.      Medication List    TAKE these medications   acetaminophen 500 MG tablet Commonly known as:  TYLENOL Take 500 mg by mouth every 6 (six) hours as needed (for pain/headache.).   albuterol 108 (90 Base) MCG/ACT inhaler Commonly known as:  PROVENTIL HFA;VENTOLIN HFA Inhale into the lungs every 6 (six) hours as needed for wheezing or shortness of breath.   budesonide-formoterol 160-4.5 MCG/ACT inhaler Commonly known as:  SYMBICORT Inhale 2 puffs into the lungs 2 (two) times daily as needed (for respiratory issues.).   escitalopram 20 MG tablet Commonly known as:  LEXAPRO Take 20 mg by mouth daily.   furosemide 20 MG tablet Commonly known as:  LASIX Take 20 mg by mouth daily as needed for fluid. Notes to patient:  Monitor Blood Pressure Daily and keep a log for primary care physician.  Monitor for symptoms of dehydration.  You may need to make changes to your medications with rapid weight loss.     JANUVIA 100 MG tablet Generic drug:  sitaGLIPtin Take 100 mg by mouth daily. Notes to patient:  Monitor Blood Sugar Frequently and keep a log for primary care physician, you may need to adjust medication dosage with rapid weight loss.     ketoconazole 2 % cream Commonly known as:  NIZORAL Apply 1 application topically daily as needed. For rash/eczema.   naproxen sodium 220 MG tablet Commonly known as:  ANAPROX Take 220 mg by mouth 2 (two) times daily as needed (for pain/headache.). Notes to patient:  Avoid NSAIDs for 6-8 weeks after surgery   pravastatin 40 MG tablet Commonly known as:  PRAVACHOL Take 40 mg by mouth daily before lunch.   quinapril-hydrochlorothiazide 20-12.5 MG tablet Commonly known as:  ACCURETIC Take 1  tablet by mouth daily. Notes to patient:  Monitor Blood Pressure Daily and keep a log for primary care physician.  You may need to make changes to your medications with rapid weight loss.     sulfamethoxazole-trimethoprim 800-160 MG tablet Commonly known as:  BACTRIM DS,SEPTRA DS Take 1 tablet by mouth 2 (two) times daily.   tolnaftate 1 % powder Commonly known as:  TINACTIN Apply 1 application topically 4 (four) times daily as needed (for fungus/skin folds).      Follow-up Information    Pedro Earls, MD. Go on 10/24/2016.   Specialty:  General Surgery Why:  at [address] information: [address] [place] [address] [phone]        Lujean Ebright B, MD Follow up.   Specialty:  General Surgery Contact information: [place] [address] [phone]           Signed: Pedro Earls 10/14/2016, 10:11 AM

## 2016-10-15 ENCOUNTER — Encounter: Payer: BC Managed Care – PPO | Attending: Surgery | Admitting: Skilled Nursing Facility1

## 2016-10-15 DIAGNOSIS — Z713 Dietary counseling and surveillance: Secondary | ICD-10-CM | POA: Diagnosis present

## 2016-10-15 DIAGNOSIS — E7800 Pure hypercholesterolemia, unspecified: Secondary | ICD-10-CM | POA: Insufficient documentation

## 2016-10-15 DIAGNOSIS — M545 Low back pain: Secondary | ICD-10-CM | POA: Diagnosis not present

## 2016-10-15 DIAGNOSIS — G4730 Sleep apnea, unspecified: Secondary | ICD-10-CM | POA: Diagnosis not present

## 2016-10-15 DIAGNOSIS — E119 Type 2 diabetes mellitus without complications: Secondary | ICD-10-CM | POA: Diagnosis not present

## 2016-10-15 DIAGNOSIS — M1990 Unspecified osteoarthritis, unspecified site: Secondary | ICD-10-CM | POA: Diagnosis not present

## 2016-10-15 DIAGNOSIS — F329 Major depressive disorder, single episode, unspecified: Secondary | ICD-10-CM | POA: Diagnosis not present

## 2016-10-16 ENCOUNTER — Encounter: Payer: Self-pay | Admitting: Skilled Nursing Facility1

## 2016-10-16 NOTE — Progress Notes (Signed)
Bariatric Class:  Appt start time: 1530 end time:  1630.  2 Week Post-Operative Nutrition Class  Patient was seen on 10/15/2016 for Post-Operative Nutrition education at [facility].   Surgery date: 09/30/2016 Surgery type: RYGB Start weight at [facility]: 282 lbs on 05/01/2016 Weight today: 281.7 lbs Weight change: 25.1 TANITA  BODY COMP RESULTS  10/16/2016   BMI (kg/m^2) 44.7   Fat Mass (lbs) 130.6   Fat Free Mass (lbs) 126   Total Body Water (lbs) 91.8   The following the learning objectives were met by the patient during this course:  Identifies Phase 3A (Soft, High Proteins) Dietary Goals and will begin from 2 weeks post-operatively to 2 months post-operatively  Identifies appropriate sources of fluids and proteins   States protein recommendations and appropriate sources post-operatively  Identifies the need for appropriate texture modifications, mastication, and bite sizes when consuming solids  Identifies appropriate multivitamin and calcium sources post-operatively  Describes the need for physical activity post-operatively and will follow MD recommendations  States when to call healthcare provider regarding medication questions or post-operative complications  Handouts given during class include:  Phase 3A: Soft, High Protein Diet Handout  Follow-Up Plan: Patient will follow-up at [facility] in 6 weeks for 2 month post-op nutrition visit for diet advancement per MD.

## 2016-11-28 ENCOUNTER — Encounter: Payer: BC Managed Care – PPO | Attending: Surgery | Admitting: Skilled Nursing Facility1

## 2016-11-28 ENCOUNTER — Encounter: Payer: Self-pay | Admitting: Skilled Nursing Facility1

## 2016-11-28 DIAGNOSIS — E119 Type 2 diabetes mellitus without complications: Secondary | ICD-10-CM | POA: Diagnosis not present

## 2016-11-28 DIAGNOSIS — F329 Major depressive disorder, single episode, unspecified: Secondary | ICD-10-CM | POA: Insufficient documentation

## 2016-11-28 DIAGNOSIS — M545 Low back pain: Secondary | ICD-10-CM | POA: Diagnosis not present

## 2016-11-28 DIAGNOSIS — G4730 Sleep apnea, unspecified: Secondary | ICD-10-CM | POA: Insufficient documentation

## 2016-11-28 DIAGNOSIS — Z713 Dietary counseling and surveillance: Secondary | ICD-10-CM | POA: Diagnosis not present

## 2016-11-28 DIAGNOSIS — E7800 Pure hypercholesterolemia, unspecified: Secondary | ICD-10-CM | POA: Insufficient documentation

## 2016-11-28 DIAGNOSIS — M1990 Unspecified osteoarthritis, unspecified site: Secondary | ICD-10-CM | POA: Diagnosis not present

## 2016-11-28 NOTE — Progress Notes (Signed)
   Primary concerns today: Post-operative Bariatric Surgery Nutrition Management.  Pt states she no longer takes any of her medications except for depression medicine. Pt states she can walk, clean her house, without issue. Pt states it takes 2 hours to drink a whole protein shake. Pt state she has had icees that are not sugar free. Pt states she struggles with thoughts of maybe she did the wrong thing by having surgery. Pt states she just does not think about drinking throughout the day.  Pt does not seem to comprehend the importance of meeting her protein and fluid needs.  Surgery date: 09/30/2016 Surgery type: RYGB Start weight at [facility]: 282 lbs on 05/01/2016 Weight today: 235.4 lbs Weight change: 46.3 TANITA  BODY COMP RESULTS  10/16/2016 11/28/2016   BMI (kg/m^2) 44.7 41   Fat Mass (lbs) 130.6 106.6   Fat Free Mass (lbs) 126 128.8   Total Body Water (lbs) 91.8 92.8    24-hr recall: B (AM): coffee protein drink (taking 2 hours) Snk (AM):  L (PM): regular yogurt (2g)----chicken pie  Snk (PM): baby bel cheese OR popcicle  D (PM): protein shake OR cottage cheese Snk (PM):   Fluid intake: coffee with protein drink, water, sweet tea, 2 milks, fairlife milk: 40 ounces  Estimated total protein intake: 50  Medications: See List Supplementation: hates the multi but has been doing it   CBG monitoring: every morning  Average CBG per patient: [phone] Last patient reported A1c:   Using straws: no Drinking while eating: no Having you been chewing well:no Chewing/swallowing difficulties: no Changes in vision: no Changes to mood/headaches: no Hair loss/Cahnges to skin/Changes to nails: no Any difficulty focusing or concentrating: no Sweating: no Dizziness/Lightheaded: a little a couple times  Palpitations: no  Carbonated beverages: no N/V/D/C/GAS: no Abdominal Pain: no Dumping syndrome: no  Recent physical activity:  ADL's  Progress Towards Goal(s):  In  progress.   Nutritional Diagnosis:  -3.3 Overweight/obesity related to past poor dietary habits and physical inactivity as evidenced by patient w/ recent RYGB surgery following dietary guidelines for continued weight loss.    Intervention:  Nutrition counseling.Dietitian educated the pt on the importance of meeting her fluid and protein needs and the consequences of not meeting those needs. Goals: -Do not drink the sweet tea -Take your time when eating: chew your food until applesauce consistency -Stop eating /drinking your protein shakes at 30 minutes, try again 2 hours latter -Try tuna packed in water and add mustard  -Be sure to overly moisten your meats  -Read your celebrate bottle and see how many chewables you are supposed to have in a day -Read your grams of carbohydrate on your [employer] brand protein shake for 5 grams or less of carbohydrate   Teaching Method Utilized:  Visual Auditory Hands on  Barriers to learning/adherence to lifestyle change: not comprehending the improtance of following the basic recommendations   Demonstrated degree of understanding via:  Teach Back   Monitoring/Evaluation:  Dietary intake, exercise, lap band fills, and body weight.

## 2016-11-28 NOTE — Patient Instructions (Addendum)
-  Do not drink the sweet tea  -Take your time when eating: chew your food until applesauce consistency  -Stop eating /drinking your protein shakes at 30 minutes, try again 2 hours latter  -Try tuna packed in water and add mustard   -Be sure to overly moisten your meats   -Read your celebrate bottle and see how many chewables you are supposed to have in a day  -Read your grams of carbohydrate on your [employer] brand protein shake for 5 grams or less of carbohydrate

## 2016-12-23 ENCOUNTER — Encounter: Payer: Self-pay | Admitting: Skilled Nursing Facility1

## 2016-12-23 ENCOUNTER — Encounter: Payer: BC Managed Care – PPO | Attending: Surgery | Admitting: Skilled Nursing Facility1

## 2016-12-23 DIAGNOSIS — M545 Low back pain: Secondary | ICD-10-CM | POA: Diagnosis not present

## 2016-12-23 DIAGNOSIS — G4730 Sleep apnea, unspecified: Secondary | ICD-10-CM | POA: Insufficient documentation

## 2016-12-23 DIAGNOSIS — E7800 Pure hypercholesterolemia, unspecified: Secondary | ICD-10-CM | POA: Insufficient documentation

## 2016-12-23 DIAGNOSIS — Z713 Dietary counseling and surveillance: Secondary | ICD-10-CM | POA: Insufficient documentation

## 2016-12-23 DIAGNOSIS — E119 Type 2 diabetes mellitus without complications: Secondary | ICD-10-CM

## 2016-12-23 DIAGNOSIS — M1990 Unspecified osteoarthritis, unspecified site: Secondary | ICD-10-CM | POA: Insufficient documentation

## 2016-12-23 DIAGNOSIS — F329 Major depressive disorder, single episode, unspecified: Secondary | ICD-10-CM | POA: Insufficient documentation

## 2016-12-23 NOTE — Progress Notes (Signed)
Primary concerns today: Post-operative Bariatric Surgery Nutrition Management.  Pt states she no longer takes any of her medications except for depression medicine. Pt states she can walk, clean her house, without issue. Pt states it takes 2 hours to drink a whole protein shake. Pt state she has had icees that are not sugar free. Pt states she struggles with thoughts of maybe she did the wrong thing by having surgery. Pt states she just does not think about drinking throughout the day.  Pt does not seem to comprehend the importance of meeting her protein and fluid needs.   Pt states things are not going so good. Pt states she cannot eat solid food. Pt states she ate fried fish and took off the breading making her vomit the rest of the day. Pt states she tried BBQ and BBQ baked beans and vomited. Pt states she tried chicken wings causing her to vomit all day. Pt states the vomit is foamy and mucous once the food is out. Pt states she has not had symptoms of low blood sugar. Pt states she can now sleep all night.  Pt states she has had smoothies with strawberries. Pt continues to eat foods her GI is not ready for getting sick as a result and then stating she does not want to eat solid food because it is just going to make her sick. Pt educated the pt on her needs to be healthy/stay independent/have a good quality of life. Pt states it take her 2 hours to drink her protein shale because she runs around the house doing things while sipping on it here and there. Pt was cautioned to not focus on how she feels currently but to focus on the future of her health.  Pt was not receptive to the education the dietitian offered.  Surgery date: 09/30/2016 Surgery type: RYGB Start weight at [facility]: 282 lbs on 05/01/2016 Weight today: 224.6 lbs Weight change: 10.8 TANITA  BODY COMP RESULTS  10/16/2016 11/28/2016 12/23/2016   BMI (kg/m^2) 44.7 41 39.2   Fat Mass (lbs) 130.6 106.6 105.8   Fat Free Mass (lbs) 126  128.8 118.8   Total Body Water (lbs) 91.8 92.8 85.2   24-hr recall: B (AM): coffee protein drink (taking 2 hours) Snk (AM):  L (PM): none (doing things and too busy)  Snk (PM): half yogurt  D (PM): protein shake OR pimento cheese Snk (PM): spoon full of cottage cheese  Fluid intake: 22 ounces coffee with protein drink, 50.7 water, 12 ounces fairlife milk: 84.7 ounces  Estimated total protein intake: 50  Medications: See List Supplementation: taking celebrate 2 times a day sometimes forgetting and calcium 2 times a day  CBG monitoring: every morning  Average CBG per patient: [phone] Last patient reported A1c:   Using straws: no Drinking while eating: no Having you been chewing well:no Chewing/swallowing difficulties: no Changes in vision: no Changes to mood/headaches: no Hair loss/Cahnges to skin/Changes to nails: no Any difficulty focusing or concentrating: no Sweating: no Dizziness/Lightheaded: a little a couple times  Palpitations: no  Carbonated beverages: no N/V/D/C/GAS: vomiting from fried fish, vomiting, constipation  Abdominal Pain: no Dumping syndrome: no  Recent physical activity:  ADL's  Progress Towards Goal(s):  In progress.   Nutritional Diagnosis:  [place]-3.3 Overweight/obesity related to past poor dietary habits and physical inactivity as evidenced by patient w/ recent RYGB surgery following dietary guidelines for continued weight loss.    Intervention:  Nutrition counseling.Dietitian educated the pt on the importance  of meeting her fluid and protein needs and the consequences of not meeting those needs. Goals: -Try to eat something every 2-3 hours  -For example:   Sit down and drink your protein shale for 30 minutes (the aim is to finish the whole thing)  1 greek yogurt  1 baby bel cheese  Cottage cheese  Fat free refried beans -Take your time eat slowly you want to take about 30 minutes each time you eat -Add a handful of vegetable to your  smoothie  -Take you calcium 3 times a day 2 hours apart from your multivitamin  Teaching Method Utilized:  Visual Auditory Hands on  Barriers to learning/adherence to lifestyle change: not comprehending the improtance of following the basic recommendations   Demonstrated degree of understanding via:  Teach Back   Monitoring/Evaluation:  Dietary intake, exercise,  and body weight.

## 2016-12-23 NOTE — Patient Instructions (Addendum)
-  Try to eat something every 2-3 hours   -For example:   Sit down and drink your protein shale for 30 minutes (the aim is to finish the whole thing)   1 greek yogurt   1 baby bel cheese   Cottage cheese   Fat free refried beans   -Take your time eat slowly you want to take about 30 minutes each time you eat  -Add a handful of vegetable to your smoothie   -Take you calcium 3 times a day 2 hours apart from your multivitamin

## 2017-10-02 DIAGNOSIS — D229 Melanocytic nevi, unspecified: Secondary | ICD-10-CM | POA: Diagnosis not present

## 2017-10-02 DIAGNOSIS — L570 Actinic keratosis: Secondary | ICD-10-CM | POA: Diagnosis not present

## 2017-10-02 DIAGNOSIS — L509 Urticaria, unspecified: Secondary | ICD-10-CM | POA: Diagnosis not present

## 2017-10-16 DIAGNOSIS — L309 Dermatitis, unspecified: Secondary | ICD-10-CM | POA: Diagnosis not present

## 2017-10-16 DIAGNOSIS — L821 Other seborrheic keratosis: Secondary | ICD-10-CM | POA: Diagnosis not present

## 2017-10-16 DIAGNOSIS — D485 Neoplasm of uncertain behavior of skin: Secondary | ICD-10-CM | POA: Diagnosis not present

## 2018-01-22 DIAGNOSIS — J22 Unspecified acute lower respiratory infection: Secondary | ICD-10-CM | POA: Diagnosis not present

## 2018-01-22 DIAGNOSIS — J45901 Unspecified asthma with (acute) exacerbation: Secondary | ICD-10-CM | POA: Diagnosis not present

## 2018-01-22 DIAGNOSIS — J029 Acute pharyngitis, unspecified: Secondary | ICD-10-CM | POA: Diagnosis not present

## 2018-03-23 ENCOUNTER — Emergency Department (HOSPITAL_COMMUNITY): Payer: BC Managed Care – PPO

## 2018-03-23 ENCOUNTER — Emergency Department (HOSPITAL_COMMUNITY)
Admission: EM | Admit: 2018-03-23 | Discharge: 2018-03-23 | Disposition: A | Discharge status: Home / Self Care | Payer: BC Managed Care – PPO | Attending: Emergency Medicine | Admitting: Emergency Medicine

## 2018-03-23 ENCOUNTER — Encounter (HOSPITAL_COMMUNITY): Payer: Self-pay | Admitting: Radiology

## 2018-03-23 ENCOUNTER — Other Ambulatory Visit: Payer: Self-pay

## 2018-03-23 DIAGNOSIS — Z79899 Other long term (current) drug therapy: Secondary | ICD-10-CM | POA: Insufficient documentation

## 2018-03-23 DIAGNOSIS — R1010 Upper abdominal pain, unspecified: Secondary | ICD-10-CM | POA: Diagnosis not present

## 2018-03-23 DIAGNOSIS — K439 Ventral hernia without obstruction or gangrene: Secondary | ICD-10-CM | POA: Diagnosis not present

## 2018-03-23 DIAGNOSIS — Z7984 Long term (current) use of oral hypoglycemic drugs: Secondary | ICD-10-CM | POA: Insufficient documentation

## 2018-03-23 DIAGNOSIS — I10 Essential (primary) hypertension: Secondary | ICD-10-CM | POA: Diagnosis not present

## 2018-03-23 DIAGNOSIS — R1011 Right upper quadrant pain: Secondary | ICD-10-CM | POA: Diagnosis not present

## 2018-03-23 DIAGNOSIS — E119 Type 2 diabetes mellitus without complications: Secondary | ICD-10-CM | POA: Insufficient documentation

## 2018-03-23 LAB — COMPREHENSIVE METABOLIC PANEL
ALT: 20 U/L (ref 0–44)
AST: 22 U/L (ref 15–41)
Albumin: 3.9 g/dL (ref 3.5–5.0)
Alkaline Phosphatase: 88 U/L (ref 38–126)
Anion gap: 10 (ref 5–15)
BUN: 14 mg/dL (ref 8–23)
CALCIUM: 9.4 mg/dL (ref 8.9–10.3)
CHLORIDE: 105 mmol/L (ref 98–111)
CO2: 26 mmol/L (ref 22–32)
CREATININE: 0.73 mg/dL (ref 0.44–1.00)
Glucose, Bld: 108 mg/dL — ABNORMAL HIGH (ref 70–99)
Potassium: 4 mmol/L (ref 3.5–5.1)
Sodium: 141 mmol/L (ref 135–145)
TOTAL PROTEIN: 6.9 g/dL (ref 6.5–8.1)
Total Bilirubin: 0.6 mg/dL (ref 0.3–1.2)

## 2018-03-23 LAB — URINALYSIS, ROUTINE W REFLEX MICROSCOPIC
BILIRUBIN URINE: NEGATIVE
Bacteria, UA: NONE SEEN
Glucose, UA: NEGATIVE mg/dL
Hgb urine dipstick: NEGATIVE
Ketones, ur: NEGATIVE mg/dL
NITRITE: NEGATIVE
PH: 6 (ref 5.0–8.0)
Protein, ur: NEGATIVE mg/dL
SPECIFIC GRAVITY, URINE: 1.011 (ref 1.005–1.030)

## 2018-03-23 LAB — CBC
HCT: 38.2 % (ref 36.0–46.0)
Hemoglobin: 12 g/dL (ref 12.0–15.0)
MCH: 28.9 pg (ref 26.0–34.0)
MCHC: 31.4 g/dL (ref 30.0–36.0)
MCV: 92 fL (ref 78.0–100.0)
PLATELETS: 186 10*3/uL (ref 150–400)
RBC: 4.15 MIL/uL (ref 3.87–5.11)
RDW: 13.2 % (ref 11.5–15.5)
WBC: 6.6 10*3/uL (ref 4.0–10.5)

## 2018-03-23 LAB — LIPASE, BLOOD: LIPASE: 27 U/L (ref 11–51)

## 2018-03-23 MED ORDER — MORPHINE SULFATE (PF) 4 MG/ML IV SOLN: 4.0000 mg | Freq: Once | INTRAVENOUS | Status: DC

## 2018-03-23 MED ORDER — PANTOPRAZOLE SODIUM 40 MG PO TBEC: 40.0000 mg | DELAYED_RELEASE_TABLET | Freq: Every day | ORAL | 0 refills | Status: DC

## 2018-03-23 MED ORDER — IOHEXOL 300 MG/ML  SOLN
100.0000 mL | Freq: Once | INTRAMUSCULAR | Status: AC | PRN
  Administered 2018-03-23: 100 mL via INTRAVENOUS

## 2018-03-23 MED ORDER — ONDANSETRON HCL 4 MG/2ML IJ SOLN: 4.0000 mg | Freq: Once | INTRAMUSCULAR | Status: DC

## 2018-03-23 MED ORDER — PANTOPRAZOLE SODIUM 40 MG PO TBEC: 40.0000 mg | DELAYED_RELEASE_TABLET | Freq: Every day | ORAL | 0 refills | Status: AC

## 2018-03-23 NOTE — ED Notes (Signed)
Up to br pain is better  She took tylenol with codeine one hour before arrival

## 2018-03-23 NOTE — ED Notes (Signed)
To ct

## 2018-03-23 NOTE — ED Triage Notes (Signed)
Patient c/o right sided pain. This began Friday and patient denies injury.

## 2018-03-23 NOTE — ED Provider Notes (Signed)
TIME SEEN: 3:41 AM  CHIEF COMPLAINT: Right-sided abdominal pain  HPI: Patient is a 72 year old female with history of obesity, hypertension, diabetes who is status post gastric bypass, BTL who presents to the emergency department with right-sided abdominal pain for the past 3 days.  Symptoms worsened after eating sausage, egg and cheese and then cheese pizza.  No nausea, vomiting or diarrhea.  No dysuria hematuria.  No vaginal bleeding or discharge.  Tried ibuprofen at home with some relief.  ROS: See HPI Constitutional: no fever  Eyes: no drainage  ENT: no runny nose   Cardiovascular:  no chest pain  Resp: no SOB  GI: no vomiting GU: no dysuria Integumentary: no rash  Allergy: no hives  Musculoskeletal: no leg swelling  Neurological: no slurred speech ROS otherwise negative  PAST MEDICAL HISTORY/PAST SURGICAL HISTORY:  Past Medical History:  Diagnosis Date  . Complication of anesthesia   . Depression   . Diabetes mellitus without complication ([place])   . Dyspnea   . Family history of adverse reaction to anesthesia    sister has nausea and vomiting after surgery  . Hypertension   . Morbid obesity ([place])   . PONV (postoperative nausea and vomiting)     MEDICATIONS:  Prior to Admission medications   Medication Sig Start Date End Date Taking? Authorizing Provider  acetaminophen (TYLENOL) 500 MG tablet Take 500 mg by mouth every 6 (six) hours as needed (for pain/headache.).    [provider]  albuterol (PROVENTIL HFA;VENTOLIN HFA) 108 (90 Base) MCG/ACT inhaler Inhale into the lungs every 6 (six) hours as needed for wheezing or shortness of breath.    [provider]  budesonide-formoterol (SYMBICORT) 160-4.5 MCG/ACT inhaler Inhale 2 puffs into the lungs 2 (two) times daily as needed (for respiratory issues.).     [provider]  escitalopram (LEXAPRO) 20 MG tablet Take 20 mg by mouth daily.    [provider]  furosemide (LASIX) 20 MG tablet  Take 20 mg by mouth daily as needed for fluid.     [provider]  JANUVIA 100 MG tablet Take 100 mg by mouth daily. 08/09/16   [provider]  ketoconazole (NIZORAL) 2 % cream Apply 1 application topically daily as needed. For rash/eczema. 09/05/16   [provider]  naproxen sodium (ANAPROX) 220 MG tablet Take 220 mg by mouth 2 (two) times daily as needed (for pain/headache.).    [provider]  pravastatin (PRAVACHOL) 40 MG tablet Take 40 mg by mouth daily before lunch.     [provider]  quinapril-hydrochlorothiazide (ACCURETIC) 20-12.5 MG per tablet Take 1 tablet by mouth daily.    [provider]  sulfamethoxazole-trimethoprim (BACTRIM DS,SEPTRA DS) 800-160 MG tablet Take 1 tablet by mouth 2 (two) times daily. 09/04/16   [provider]  tolnaftate (TINACTIN) 1 % powder Apply 1 application topically 4 (four) times daily as needed (for fungus/skin folds).    [provider]    ALLERGIES:  Allergies  Allergen Reactions  . Metformin Diarrhea and Other (See Comments)    Stomach cramps  . Penicillins Other (See Comments)    Faint, passes out  Has patient had a PCN reaction causing immediate rash, facial/tongue/throat swelling, SOB or lightheadedness with hypotension:No Has patient had a PCN reaction causing severe rash involving mucus membranes or skin necrosis:No Has patient had a PCN reaction that required hospitalization:No Has patient had a PCN reaction occurring within the last 10 years:No If all of the above answers  are "NO", then may proceed with Cephalosporin use.     SOCIAL HISTORY:  Social History   Tobacco Use  . Smoking status: Never Smoker  . Smokeless tobacco: Never Used  Substance Use Topics  . Alcohol use: No    FAMILY HISTORY: Family History  Problem Relation Age of Onset  . Diabetes Mother   . Diabetes Other   . Cancer Other     EXAM: BP (!) 175/91 (BP Location: Right Arm)   Pulse  60   Temp 97.8 F (36.6 C) (Oral)   Resp 17   Ht 5\' 3"  (1.6 m)   Wt 99.8 kg   SpO2 99%   BMI 38.97 kg/m  CONSTITUTIONAL: Alert and oriented and responds appropriately to questions. Well-appearing; well-nourished HEAD: Normocephalic EYES: Conjunctivae clear, pupils appear equal, EOMI ENT: normal nose; moist mucous membranes NECK: Supple, no meningismus, no nuchal rigidity, no LAD  CARD: RRR; S1 and S2 appreciated; no murmurs, no clicks, no rubs, no gallops RESP: Normal chest excursion without splinting or tachypnea; breath sounds clear and equal bilaterally; no wheezes, no rhonchi, no rales, no hypoxia or respiratory distress, speaking full sentences ABD/GI: Normal bowel sounds; non-distended; soft, tender to palpation in the right upper quadrant with positive Murphy sign no peritoneal signs, no hepatosplenomegaly BACK:  The back appears normal and is non-tender to palpation, there is no CVA tenderness EXT: Normal ROM in all joints; non-tender to palpation; no edema; normal capillary refill; no cyanosis, no calf tenderness or swelling    SKIN: Normal color for age and race; warm; no rash NEURO: Moves all extremities equally PSYCH: The patient's mood and manner are appropriate. Grooming and personal hygiene are appropriate.  MEDICAL DECISION MAKING: Patient here with right upper quadrant pain.  Concern for cholelithiasis, cholecystitis, gastritis, colitis.  Labs unremarkable including normal white blood cell count, normal LFTs and lipase.  Less likely pancreatitis.  Doubt appendicitis, diverticulitis, bowel obstruction.  Will give pain and nausea medicine and start with right upper quadrant ultrasound.  ED PROGRESS: Patient's ultrasound is normal.  Will obtain CT of her abdomen pelvis for further evaluation.    CT scan shows no acute abnormality.  She is a stable renal cyst on her right kidney that she is aware of.  Appendix is normal.  Unclear etiology for patient's pain.  We will treat  her for possible gastritis and discharged with GI follow-up as she may need endoscopy.  Patient has an appointment with her PCP today at 4 PM.  She is comfortable with this plan.  Recommend a bland diet for the next several days.   At this time, I do not feel there is any life-threatening condition present. I have reviewed and discussed all results (EKG, imaging, lab, urine as appropriate) and exam findings with patient/family. I have reviewed nursing notes and appropriate previous records.  I feel the patient is safe to be discharged home without further emergent workup and can continue workup as an outpatient as needed. Discussed usual and customary return precautions. Patient/family verbalize understanding and are comfortable with this plan.  Outpatient follow-up has been provided if needed. All questions have been answered.     Keyonia Gluth, Delice Bison, DO 03/23/18 [phone]

## 2018-03-23 NOTE — ED Notes (Signed)
Pt reports pain 7-8/10, however does not want pain medication.

## 2019-04-21 ENCOUNTER — Encounter (HOSPITAL_COMMUNITY): Payer: Self-pay

## 2019-12-08 ENCOUNTER — Other Ambulatory Visit: Payer: Self-pay | Admitting: Internal Medicine

## 2019-12-08 DIAGNOSIS — M5440 Lumbago with sciatica, unspecified side: Secondary | ICD-10-CM

## 2019-12-29 DIAGNOSIS — M545 Low back pain: Secondary | ICD-10-CM | POA: Diagnosis not present

## 2020-01-19 ENCOUNTER — Other Ambulatory Visit: Payer: BC Managed Care – PPO

## 2020-01-19 DIAGNOSIS — M545 Low back pain: Secondary | ICD-10-CM | POA: Diagnosis not present

## 2020-02-15 DIAGNOSIS — L820 Inflamed seborrheic keratosis: Secondary | ICD-10-CM | POA: Diagnosis not present

## 2020-02-15 DIAGNOSIS — D485 Neoplasm of uncertain behavior of skin: Secondary | ICD-10-CM | POA: Diagnosis not present

## 2020-02-15 DIAGNOSIS — L308 Other specified dermatitis: Secondary | ICD-10-CM | POA: Diagnosis not present

## 2020-02-15 DIAGNOSIS — L304 Erythema intertrigo: Secondary | ICD-10-CM | POA: Diagnosis not present

## 2020-02-15 DIAGNOSIS — D225 Melanocytic nevi of trunk: Secondary | ICD-10-CM | POA: Diagnosis not present

## 2020-04-16 DIAGNOSIS — Z23 Encounter for immunization: Secondary | ICD-10-CM | POA: Diagnosis not present

## 2020-04-26 ENCOUNTER — Encounter (HOSPITAL_COMMUNITY): Payer: Self-pay

## 2021-05-03 ENCOUNTER — Encounter (HOSPITAL_COMMUNITY): Payer: Self-pay | Admitting: *Deleted

## 2021-07-19 DIAGNOSIS — M79643 Pain in unspecified hand: Secondary | ICD-10-CM | POA: Diagnosis not present

## 2021-07-19 DIAGNOSIS — M1990 Unspecified osteoarthritis, unspecified site: Secondary | ICD-10-CM | POA: Diagnosis not present

## 2021-07-19 DIAGNOSIS — M2550 Pain in unspecified joint: Secondary | ICD-10-CM | POA: Diagnosis not present

## 2021-07-19 DIAGNOSIS — L309 Dermatitis, unspecified: Secondary | ICD-10-CM | POA: Diagnosis not present

## 2021-07-19 DIAGNOSIS — M25562 Pain in left knee: Secondary | ICD-10-CM | POA: Diagnosis not present

## 2021-12-14 ENCOUNTER — Emergency Department (HOSPITAL_BASED_OUTPATIENT_CLINIC_OR_DEPARTMENT_OTHER): Payer: BC Managed Care – PPO | Admitting: Radiology

## 2021-12-14 ENCOUNTER — Encounter (HOSPITAL_BASED_OUTPATIENT_CLINIC_OR_DEPARTMENT_OTHER): Payer: Self-pay

## 2021-12-14 ENCOUNTER — Other Ambulatory Visit: Payer: Self-pay

## 2021-12-14 ENCOUNTER — Emergency Department (HOSPITAL_BASED_OUTPATIENT_CLINIC_OR_DEPARTMENT_OTHER)
Admission: EM | Admit: 2021-12-14 | Discharge: 2021-12-14 | Discharge status: Against Medical Advice | Payer: BC Managed Care – PPO | Attending: Emergency Medicine | Admitting: Emergency Medicine

## 2021-12-14 DIAGNOSIS — R059 Cough, unspecified: Secondary | ICD-10-CM | POA: Diagnosis not present

## 2021-12-14 DIAGNOSIS — Z5321 Procedure and treatment not carried out due to patient leaving prior to being seen by health care provider: Secondary | ICD-10-CM | POA: Diagnosis not present

## 2021-12-14 DIAGNOSIS — R0602 Shortness of breath: Secondary | ICD-10-CM | POA: Diagnosis not present

## 2021-12-14 NOTE — ED Notes (Signed)
Pt came out of room and told secretary that she was leaving and could not wait any longer.

## 2021-12-14 NOTE — ED Triage Notes (Signed)
Cough x 2 weeks with green sputum production. PCP called in Azithromycin '500mg'$  this past Monday.   Pt concerned of pna and shob when ambulating.

## 2021-12-14 NOTE — ED Notes (Addendum)
Per secretary Magda Paganini pt states she is leaving due to a long wait as she has to get up early in the morning for a graduation.   Pt was roomed awaiting sign up from EDP.

## 2021-12-14 NOTE — ED Notes (Signed)
Pt expressed to RN that wait times were too long and she has to be up early in the morning for a graduation. Explained to pt that MD would be in to see her soon due to chest xr being resulted. Pt sts that she will stay a little while longer to see MD.

## 2022-04-03 DIAGNOSIS — M1990 Unspecified osteoarthritis, unspecified site: Secondary | ICD-10-CM | POA: Diagnosis not present

## 2022-04-03 DIAGNOSIS — M25562 Pain in left knee: Secondary | ICD-10-CM | POA: Diagnosis not present

## 2022-04-03 DIAGNOSIS — M2550 Pain in unspecified joint: Secondary | ICD-10-CM | POA: Diagnosis not present

## 2022-04-03 DIAGNOSIS — M25561 Pain in right knee: Secondary | ICD-10-CM | POA: Diagnosis not present

## 2022-04-11 ENCOUNTER — Ambulatory Visit (INDEPENDENT_AMBULATORY_CARE_PROVIDER_SITE_OTHER): Payer: BC Managed Care – PPO

## 2022-04-11 ENCOUNTER — Telehealth: Payer: Medicare Other

## 2022-04-11 ENCOUNTER — Ambulatory Visit (INDEPENDENT_AMBULATORY_CARE_PROVIDER_SITE_OTHER): Payer: BC Managed Care – PPO | Admitting: Orthopaedic Surgery

## 2022-04-11 VITALS — Ht 62.0 in | Wt 243.4 lb

## 2022-04-11 DIAGNOSIS — M1711 Unilateral primary osteoarthritis, right knee: Secondary | ICD-10-CM | POA: Diagnosis not present

## 2022-04-11 DIAGNOSIS — Z6841 Body Mass Index (BMI) 40.0 and over, adult: Secondary | ICD-10-CM | POA: Insufficient documentation

## 2022-04-11 NOTE — Telephone Encounter (Signed)
Can we get approval for patient for R knee synvisc injection

## 2022-04-11 NOTE — Telephone Encounter (Signed)
yes

## 2022-04-11 NOTE — Progress Notes (Signed)
Office Visit Note   Patient: Lindsey Sanders           Date of Birth: 02-06-1946           MRN: [id number] Visit Date: 04/11/2022              Requested by: Merrilee Seashore, [address] [place],  [place] [address] PCP: Merrilee Seashore, MD   Assessment & Plan: Visit Diagnoses:  1. Primary osteoarthritis of right knee   2. Body mass index 40.0-44.9, adult (HCC)     Plan: Impression is end-stage right knee varus DJD.  Medial compartment is bone-on-bone.  Currently her weight is an issue and we have talked about weight loss.  Goal weight is 225 pounds.  We will provide her with a handicap placard and an OA reaction brace.  We will get approval for viscosupplementation as she has had relief from these in the past.  This patient is diagnosed with osteoarthritis of the knee(s).    Radiographs show evidence of joint space narrowing, osteophytes, subchondral sclerosis and/or subchondral cysts.  This patient has knee pain which interferes with functional and activities of daily living.    This patient has experienced inadequate response, adverse effects and/or intolerance with conservative treatments such as acetaminophen, NSAIDS, topical creams, physical therapy or regular exercise, knee bracing and/or weight loss.   This patient has experienced inadequate response or has a contraindication to intra articular steroid injections for at least 3 months.   This patient is not scheduled to have a total knee replacement within 6 months of starting treatment with viscosupplementation.  Follow-Up Instructions: No follow-ups on file.   Orders:  Orders Placed This Encounter  Procedures   XR KNEE 3 VIEW RIGHT   No orders of the defined types were placed in this encounter.     Procedures: No procedures performed   Clinical Data: No additional findings.   Subjective: Chief Complaint  Patient presents with   Right Knee - Pain    HPI Lindsey Sanders is a very  pleasant 76 year old female works for [employer] who comes in for constant right knee pain.  Recent worsening in the last few years.  Underwent right knee scope years ago by Dr. Lawana Chambers for meniscal tear.  Sometimes she has trouble walking.  She is still very active.  Takes Aleve for the pain.  Denies any mechanical symptoms.  Review of Systems  Constitutional: Negative.   HENT: Negative.    Eyes: Negative.   Respiratory: Negative.    Cardiovascular: Negative.   Endocrine: Negative.   Musculoskeletal: Negative.   Neurological: Negative.   Hematological: Negative.   Psychiatric/Behavioral: Negative.    All other systems reviewed and are negative.    Objective: Vital Signs: Ht '5\' 2"'$  (1.575 m)   Wt 243 lb 6.4 oz (110.4 kg)   BMI 44.52 kg/m   Physical Exam Vitals and nursing note reviewed.  Constitutional:      Appearance: She is well-developed.  HENT:     Head: Atraumatic.     Nose: Nose normal.  Eyes:     Extraocular Movements: Extraocular movements intact.  Cardiovascular:     Pulses: Normal pulses.  Pulmonary:     Effort: Pulmonary effort is normal.  Abdominal:     Palpations: Abdomen is soft.  Musculoskeletal:     Cervical back: Neck supple.  Skin:    General: Skin is warm.     Capillary Refill: Capillary refill takes less than  2 seconds.  Neurological:     Mental Status: She is alert. Mental status is at baseline.  Psychiatric:        Behavior: Behavior normal.        Thought Content: Thought content normal.        Judgment: Judgment normal.     Ortho Exam Examination right knee shows medial joint line tenderness.  Collaterals and cruciates are stable.  2+ patellofemoral crepitus.  Pain throughout range of motion.  No joint effusion.  Specialty Comments:  No specialty comments available.  Imaging: XR KNEE 3 VIEW RIGHT  Result Date: 04/11/2022 Medial compartment is nearly bone-on-bone.  Periarticular spurring.    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   Esophageal reflux    Morbid obesity ([place])    HLD (hyperlipidemia)    CAP (community acquired pneumonia) 03/13/2015   Diabetes mellitus ([place]) 03/13/2015   Reactive airway disease 03/13/2015   Respiratory failure, acute ([place]) 03/13/2015   Past Medical History:  Diagnosis Date   Complication of anesthesia    Depression    Diabetes mellitus without complication ([place])    Dyspnea    Family history of adverse reaction to anesthesia    sister has nausea and vomiting after surgery   Hypertension    Morbid obesity ([place])    PONV (postoperative nausea and vomiting)     Family History  Problem Relation Age of Onset   Diabetes Mother    Diabetes Other    Cancer Other     Past Surgical History:  Procedure Laterality Date   BREAST SURGERY     EYE SURGERY     bilateral cataract surgery    GASTRIC ROUX-EN-Y N/A 09/30/2016   Procedure: LAPAROSCOPIC ROUX-EN-Y GASTRIC BYPASS WITH UPPER ENDOSCOPY;  Surgeon: Johnathan Hausen, MD;  Location: [facility];  Service: General;  Laterality: N/A;   KNEE SURGERY     TUBAL LIGATION     Social History   Occupational History   Not on file  Tobacco Use   Smoking status: Never   Smokeless tobacco: Never  Substance and Sexual Activity   Alcohol use: No   Drug use: No   Sexual activity: Not on file

## 2022-04-12 NOTE — Telephone Encounter (Signed)
VOB submitted for SynviscOne, right knee.  

## 2022-04-16 ENCOUNTER — Ambulatory Visit: Payer: Medicare Other | Admitting: Orthopaedic Surgery

## 2022-04-17 ENCOUNTER — Encounter (HOSPITAL_COMMUNITY): Payer: Self-pay | Admitting: *Deleted

## 2022-04-24 ENCOUNTER — Telehealth: Payer: Self-pay

## 2022-04-24 NOTE — Telephone Encounter (Signed)
PA submitted online through covermymeds for SynviscOne, right knee. Pending# [id number]

## 2022-08-05 ENCOUNTER — Telehealth: Payer: Self-pay

## 2022-08-05 NOTE — Telephone Encounter (Signed)
VOB resubmitted for SynviscOne, right knee due to new year. PA previously approved

## 2022-08-29 ENCOUNTER — Telehealth: Payer: Self-pay

## 2022-08-29 DIAGNOSIS — M1711 Unilateral primary osteoarthritis, right knee: Secondary | ICD-10-CM

## 2022-08-29 NOTE — Telephone Encounter (Signed)
Called and left a VM advising pateint to CB to schedule for gel injection with Dr. Erlinda Hong.  Check referral tab

## 2023-02-26 ENCOUNTER — Emergency Department (HOSPITAL_COMMUNITY): Payer: Worker's Compensation

## 2023-02-26 ENCOUNTER — Emergency Department (HOSPITAL_COMMUNITY)
Admission: EM | Admit: 2023-02-26 | Discharge: 2023-02-26 | Disposition: A | Discharge status: Home / Self Care | Payer: Worker's Compensation

## 2023-02-26 DIAGNOSIS — R609 Edema, unspecified: Secondary | ICD-10-CM | POA: Diagnosis not present

## 2023-02-26 DIAGNOSIS — S4981XA Other specified injuries of right shoulder and upper arm, initial encounter: Secondary | ICD-10-CM | POA: Diagnosis not present

## 2023-02-26 DIAGNOSIS — M25562 Pain in left knee: Secondary | ICD-10-CM | POA: Insufficient documentation

## 2023-02-26 DIAGNOSIS — M25519 Pain in unspecified shoulder: Secondary | ICD-10-CM | POA: Diagnosis not present

## 2023-02-26 DIAGNOSIS — W19XXXA Unspecified fall, initial encounter: Secondary | ICD-10-CM | POA: Diagnosis not present

## 2023-02-26 DIAGNOSIS — M25511 Pain in right shoulder: Secondary | ICD-10-CM | POA: Insufficient documentation

## 2023-02-26 DIAGNOSIS — W1830XA Fall on same level, unspecified, initial encounter: Secondary | ICD-10-CM | POA: Insufficient documentation

## 2023-02-26 NOTE — ED Triage Notes (Signed)
Pt arrives from [facility] for mechanical trip and fall. Went down on L knee and then R shoulder. No head trauma. Bruising to L knee. Pain with palpation of both sites, pain if attempting to lift arm past shoulder. Some pain going towards neck, no neck or back pain. No artificial joints. No deformity. No thinners  154/98 Hr 98 RR 18 98% RA CBG 152

## 2023-02-26 NOTE — ED Provider Notes (Signed)
[facility] [facility] AT [facility] Provider Note   CSN: [id number] Arrival date & time: 02/26/23  1405     History  Chief Complaint  Patient presents with   Fall   Knee Pain   Shoulder Pain    Lindsey Sanders is a 77 y.o. female.  Presents with right shoulder pain and left knee pain after mechanical fall.  Did not hit her head no LOC.  Denies any numbness tingling change in sensation in extremities.   Fall  Knee Pain Shoulder Pain      Home Medications Prior to Admission medications   Medication Sig Start Date End Date Taking? Authorizing Provider  acetaminophen (TYLENOL) 500 MG tablet Take 500 mg by mouth every 6 (six) hours as needed (for pain/headache.).    [provider]  albuterol (PROVENTIL HFA;VENTOLIN HFA) 108 (90 Base) MCG/ACT inhaler Inhale into the lungs every 6 (six) hours as needed for wheezing or shortness of breath.    [provider]  budesonide-formoterol (SYMBICORT) 160-4.5 MCG/ACT inhaler Inhale 2 puffs into the lungs 2 (two) times daily as needed (for respiratory issues.).     [provider]  escitalopram (LEXAPRO) 20 MG tablet Take 20 mg by mouth daily.    [provider]  furosemide (LASIX) 20 MG tablet Take 20 mg by mouth daily as needed for fluid.     [provider]  JANUVIA 100 MG tablet Take 100 mg by mouth daily. 08/09/16   [provider]  ketoconazole (NIZORAL) 2 % cream Apply 1 application topically daily as needed. For rash/eczema. 09/05/16   [provider]  naproxen sodium (ANAPROX) 220 MG tablet Take 220 mg by mouth 2 (two) times daily as needed (for pain/headache.).    [provider]  pantoprazole (PROTONIX) 40 MG tablet Take 1 tablet (40 mg total) by mouth daily. 03/23/18   Ward, Layla Maw, DO  pravastatin (PRAVACHOL) 40 MG tablet Take 40 mg by mouth daily before lunch.     [provider]  quinapril-hydrochlorothiazide (ACCURETIC)  20-12.5 MG per tablet Take 1 tablet by mouth daily.    [provider]  sulfamethoxazole-trimethoprim (BACTRIM DS,SEPTRA DS) 800-160 MG tablet Take 1 tablet by mouth 2 (two) times daily. 09/04/16   [provider]  tolnaftate (TINACTIN) 1 % powder Apply 1 application topically 4 (four) times daily as needed (for fungus/skin folds).    [provider]      Allergies    Metformin, Metformin hcl er, Penicillin g, and Penicillins    Review of Systems   Review of Systems  Physical Exam Updated Vital Signs BP (!) 154/79 (BP Location: Left Arm)   Pulse 79   Temp 98.6 F (37 C) (Oral)   Resp 15   SpO2 99%  Physical Exam Musculoskeletal:     Comments: Some diffuse tenderness about the shoulder.  Normal sensation.  5 out of 5 bicep tricep strength.  Also has some swelling to her left knee.  Some tenderness over the patella.  Normal sensation in lower extremities.  Good motor function.     ED Results / Procedures / Treatments   Labs (all labs ordered are listed, but only abnormal results are displayed) Labs Reviewed - No data to display  EKG None  Radiology DG Shoulder Right Port  Result Date: 02/26/2023 CLINICAL DATA:  Fall.  Pain. EXAM: RIGHT SHOULDER - 1 VIEW COMPARISON:  None Available. FINDINGS: Moderate acromioclavicular joint space narrowing, subchondral sclerosis, and peripheral osteophytosis.  On the provided oblique images, there appears to be mild inferior glenoid degenerative osteophytosis. The glenohumeral joint space is not well evaluated. No definitive acute fracture line is seen. Mild-to-moderate lateral downsloping of the acromion. IMPRESSION: 1. No definitive acute fracture line is seen. 2. Moderate acromioclavicular osteoarthritis. 3. Mild-to-moderate lateral downsloping of the acromion. Electronically Signed   By: Neita Garnet M.D.   On: 02/26/2023 16:13   DG Knee Complete 4 Views Left  Result Date: 02/26/2023 CLINICAL DATA:  Fall.  Bruising  to left knee.  Pain. EXAM: LEFT KNEE - COMPLETE 4+ VIEW COMPARISON:  None Available. FINDINGS: Mild-to-moderate medial and patellofemoral compartment joint space narrowing and peripheral osteophytes. Tiny joint effusion. Mild chronic enthesopathic change at the quadriceps and patellar insertions on the patella and the patellar tendon insertion on the tibial tubercle. No acute fracture is seen. No dislocation. IMPRESSION: 1. Mild-to-moderate medial and patellofemoral compartment osteoarthritis. 2. Tiny joint effusion. Electronically Signed   By: Neita Garnet M.D.   On: 02/26/2023 16:10    Procedures Procedures    Medications Ordered in ED Medications - No data to display  ED Course/ Medical Decision Making/ A&P Clinical Course as of 02/26/23 1649  Wed Feb 26, 2023  1616 DG Shoulder Right Port IMPRESSION: 1. No definitive acute fracture line is seen. 2. Moderate acromioclavicular osteoarthritis. 3. Mild-to-moderate lateral downsloping of the acromion.   [TY]  1617 DG Knee Complete 4 Views Left IMPRESSION: 1. Mild-to-moderate medial and patellofemoral compartment osteoarthritis. 2. Tiny joint effusion.   [TY]    Clinical Course User Index [TY] Coral Spikes, DO                                 Medical Decision Making 77 year old female with fall right shoulder pain left knee pain.  Afebrile vital signs are reassuring.  Physical exam with no overt injury.  X-rays negative for acute fracture.  Considered head CT, however not indicated based on CT head rules.  No focal deficits on exam.  Offered pain medications.  Patient declined.  Will place in sling for comfort.  Follow-up with Ortho as needed.  Amount and/or Complexity of Data Reviewed Labs:     Details: Consider labs, however mechanical fall labs unlikely to change management or disposition. Radiology: ordered. Decision-making details documented in ED Course.           Final Clinical Impression(s) / ED  Diagnoses Final diagnoses:  Acute pain of right shoulder    Rx / DC Orders ED Discharge Orders     None         Coral Spikes, DO 02/26/23 1649

## 2023-02-26 NOTE — Progress Notes (Signed)
Orthopedic Tech Progress Note Patient Details:  Lindsey Sanders Apr 06, 1946 [id number]  Ortho Devices Type of Ortho Device: Shoulder immobilizer Ortho Device/Splint Location: right Ortho Device/Splint Interventions: Ordered, Application, Adjustment   Post Interventions Patient Tolerated: Well Instructions Provided: Care of device, Adjustment of device  [name] 02/26/2023, 5:02 PM

## 2023-02-26 NOTE — Discharge Instructions (Signed)
Take Tylenol alternate with ibuprofen for pain.  Remain in the sling for comfort, but you may use your arm.  Return immediately for fevers, chills, sudden onset headache, vision changes, unilateral weakness or any new or worsening symptoms that are concerning to you.

## 2023-05-01 ENCOUNTER — Encounter (HOSPITAL_COMMUNITY): Payer: Self-pay | Admitting: *Deleted

## 2023-07-23 DIAGNOSIS — R5383 Other fatigue: Secondary | ICD-10-CM | POA: Diagnosis not present

## 2023-07-23 DIAGNOSIS — I10 Essential (primary) hypertension: Secondary | ICD-10-CM | POA: Diagnosis not present

## 2023-07-23 DIAGNOSIS — N182 Chronic kidney disease, stage 2 (mild): Secondary | ICD-10-CM | POA: Diagnosis not present

## 2023-07-23 DIAGNOSIS — E1165 Type 2 diabetes mellitus with hyperglycemia: Secondary | ICD-10-CM | POA: Diagnosis not present

## 2023-07-23 DIAGNOSIS — E782 Mixed hyperlipidemia: Secondary | ICD-10-CM | POA: Diagnosis not present

## 2023-12-03 NOTE — H&P (Signed)
 Patient's anticipated LOS is less than 2 midnights, meeting these requirements: - Younger than 59 - Lives within 1 hour of care - Has a competent adult at home to recover with post-op recover - NO history of  - Chronic pain requiring opiods  - Diabetes  - Coronary Artery Disease  - Heart failure  - Heart attack  - Stroke  - DVT/VTE  - Cardiac arrhythmia  - Respiratory Failure/COPD  - Renal failure  - Anemia  - Advanced Liver disease     Lindsey Sanders is an 78 y.o. female.    Chief Complaint: right shoulder pain  HPI: Pt is a 78 y.o. female complaining of right shoulder pain for multiple years. Pain had continually increased since the beginning. X-rays in the clinic show end-stage arthritic changes of the right shoulder. Pt has tried various conservative treatments which have failed to alleviate their symptoms, including injections and therapy. Various options are discussed with the patient. Risks, benefits and expectations were discussed with the patient. Patient understand the risks, benefits and expectations and wishes to proceed with surgery.   PCP:  Virgle Grime, MD  D/C Plans: Home  PMH: Past Medical History:  Diagnosis Date   Complication of anesthesia    Depression    Diabetes mellitus without complication (HCC)    Dyspnea    Family history of adverse reaction to anesthesia    sister has nausea and vomiting after surgery   Hypertension    Morbid obesity (HCC)    PONV (postoperative nausea and vomiting)     PSH: Past Surgical History:  Procedure Laterality Date   BREAST SURGERY     EYE SURGERY     bilateral cataract surgery    GASTRIC ROUX-EN-Y N/A 09/30/2016   Procedure: LAPAROSCOPIC ROUX-EN-Y GASTRIC BYPASS WITH UPPER ENDOSCOPY;  Surgeon: Jacolyn Matar, MD;  Location: [facility];  Service: General;  Laterality: N/A;   KNEE SURGERY     TUBAL LIGATION      Social History:  reports that she has never smoked. She has never used smokeless tobacco.  She reports that she does not drink alcohol and does not use drugs. BMI: Estimated body mass index is 44.52 kg/m as calculated from the following:   Height as of 04/11/22: 5\' 2"  (1.575 m).   Weight as of 04/11/22: 110.4 kg.  Lab Results  Component Value Date   ALBUMIN 3.9 03/23/2018   Diabetes:   Patient has a diagnosis of diabetes,  Lab Results  Component Value Date   HGBA1C 7.0 (H) 03/14/2015   Smoking Status:   reports that she has never smoked. She has never used smokeless tobacco.    Allergies:  Allergies  Allergen Reactions   Metformin Diarrhea and Other (See Comments)    Stomach cramps   Metformin Hcl Er Other (See Comments)   Penicillin G Other (See Comments)   Penicillins Other (See Comments)    Faint, passes out  Has patient had a PCN reaction causing immediate rash, facial/tongue/throat swelling, SOB or lightheadedness with hypotension:No Has patient had a PCN reaction causing severe rash involving mucus membranes or skin necrosis:No Has patient had a PCN reaction that required hospitalization:No Has patient had a PCN reaction occurring within the last 10 years:No If all of the above answers are "NO", then may proceed with Cephalosporin use.     Medications: No current facility-administered medications for this encounter.   Current Outpatient Medications  Medication Sig Dispense Refill   acetaminophen  (TYLENOL ) 500 MG tablet Take  500 mg by mouth every 6 (six) hours as needed (for pain/headache.).     albuterol  (PROVENTIL  HFA;VENTOLIN  HFA) 108 (90 Base) MCG/ACT inhaler Inhale into the lungs every 6 (six) hours as needed for wheezing or shortness of breath.     budesonide -formoterol  (SYMBICORT ) 160-4.5 MCG/ACT inhaler Inhale 2 puffs into the lungs 2 (two) times daily as needed (for respiratory issues.).      escitalopram  (LEXAPRO ) 20 MG tablet Take 20 mg by mouth daily.     furosemide (LASIX) 20 MG tablet Take 20 mg by mouth daily as needed for fluid.       JANUVIA 100 MG tablet Take 100 mg by mouth daily.  5   ketoconazole (NIZORAL) 2 % cream Apply 1 application topically daily as needed. For rash/eczema.  2   naproxen sodium (ANAPROX) 220 MG tablet Take 220 mg by mouth 2 (two) times daily as needed (for pain/headache.).     pantoprazole  (PROTONIX ) 40 MG tablet Take 1 tablet (40 mg total) by mouth daily. 30 tablet 0   pravastatin  (PRAVACHOL ) 40 MG tablet Take 40 mg by mouth daily before lunch.      quinapril -hydrochlorothiazide  (ACCURETIC ) 20-12.5 MG per tablet Take 1 tablet by mouth daily.     sulfamethoxazole-trimethoprim (BACTRIM DS,SEPTRA DS) 800-160 MG tablet Take 1 tablet by mouth 2 (two) times daily.  0   tolnaftate (TINACTIN) 1 % powder Apply 1 application topically 4 (four) times daily as needed (for fungus/skin folds).      No results found for this or any previous visit (from the past 48 hours). No results found.  ROS: Pain with rom of the right upper extremity  Physical Exam: Alert and oriented 78 y.o. female in no acute distress Cranial nerves 2-12 intact Cervical spine: full rom with no tenderness, nv intact distally Chest: active breath sounds bilaterally, no wheeze rhonchi or rales Heart: regular rate and rhythm, no murmur Abd: non tender non distended with active bowel sounds Hip is stable with rom  Right shoulder painful and weak rom Nv intact distally No rashes or edema distally  Assessment/Plan Assessment: right shoulder cuff arthropathy  Plan:  Patient will undergo a right reverse total shoulder by Dr. Brunilda Capra at [facility] Risks benefits and expectations were discussed with the patient. Patient understand risks, benefits and expectations and wishes to proceed. Preoperative templating of the joint replacement has been completed, documented, and submitted to the Operating Room personnel in order to optimize intra-operative equipment management.   Lorina Roosevelt PA-C, MPAS [facility] is now [employer] [address], [address], [place], [place] [address] Phone: [phone] [email or web address] Facebook  [employer]

## 2023-12-25 NOTE — Patient Instructions (Addendum)
 SURGICAL WAITING ROOM VISITATION Patients having surgery or a procedure may have no more than 2 support people in the waiting area - these visitors may rotate.    Children under the age of 43 must have an adult with them who is not the patient.  If the patient needs to stay at the hospital during part of their recovery, the visitor guidelines for inpatient rooms apply. Pre-op nurse will coordinate an appropriate time for 1 support person to accompany patient in pre-op.  This support person may not rotate.    Please refer to the [facility] website for the visitor guidelines for Inpatients (after your surgery is over and you are in a regular room).       Your procedure is scheduled on: 01-02-24   Report to [facility] Main Entrance    Report to admitting at 10:15 AM   Call this number if you have problems the morning of surgery [phone]   Do not eat food :After Midnight.   After Midnight you may have the following liquids until 9:45 AM DAY OF SURGERY  Water Non-Citrus Juices (without pulp, NO RED-Apple, White grape, White cranberry) Black Coffee (NO MILK/CREAM OR CREAMERS, sugar ok)  Clear Tea (NO MILK/CREAM OR CREAMERS, sugar ok) regular and decaf                             Plain Jell-O (NO RED)                                           Fruit ices (not with fruit pulp, NO RED)                                     Popsicles (NO RED)                                                               Sports drinks like Gatorade (NO RED)                   The day of surgery:  Drink ONE (1) Pre-Surgery G2 by 9:45 AM the morning of surgery. Drink in one sitting. Do not sip.  This drink was given to you during your hospital  pre-op appointment visit. Nothing else to drink after completing the Pre-Surgery G2.          If you have questions, please contact your surgeon's office.   FOLLOW  ANY ADDITIONAL PRE OP INSTRUCTIONS YOU RECEIVED FROM YOUR SURGEON'S OFFICE!!!      Oral Hygiene is also important to reduce your risk of infection.                                    Remember - BRUSH YOUR TEETH THE MORNING OF SURGERY WITH YOUR REGULAR TOOTHPASTE   Do NOT smoke after Midnight   Take these medicines the morning of surgery with A SIP OF WATER:    Escitalopram    Ezetimibe  Montelukast   Pantoprazole    Pravastatin    Tylenol , Oxycodone  if needed   Okay to use inhalers   Stop all vitamins and herbal supplements 7 days before surgery  How to Manage Your Diabetes Before and After Surgery  Why is it important to control my blood sugar before and after surgery? Improving blood sugar levels before and after surgery helps healing and can limit problems. A way of improving blood sugar control is eating a healthy diet by:  Eating less sugar and carbohydrates  Increasing activity/exercise  Talking with your doctor about reaching your blood sugar goals High blood sugars (greater than 180 mg/dL) can raise your risk of infections and slow your recovery, so you will need to focus on controlling your diabetes during the weeks before surgery. Make sure that the doctor who takes care of your diabetes knows about your planned surgery including the date and location.  How do I manage my blood sugar before surgery? Check your blood sugar at least 4 times a day, starting 2 days before surgery, to make sure that the level is not too high or low. Check your blood sugar the morning of your surgery when you wake up and every 2 hours until you get to the Short Stay unit. If your blood sugar is less than 70 mg/dL, you will need to treat for low blood sugar: Do not take insulin . Treat a low blood sugar (less than 70 mg/dL) with  cup of clear juice (cranberry or apple), 4 glucose tablets, OR glucose gel. Recheck blood sugar in 15 minutes after treatment (to make sure it is greater than 70 mg/dL). If your blood sugar is not greater than 70 mg/dL on recheck, call [phone] for  further instructions. Report your blood sugar to the short stay nurse when you get to Short Stay.  If you are admitted to the hospital after surgery: Your blood sugar will be checked by the staff and you will probably be given insulin  after surgery (instead of oral diabetes medicines) to make sure you have good blood sugar levels. The goal for blood sugar control after surgery is 80-180 mg/dL.   WHAT DO I DO ABOUT MY DIABETES MEDICATION?  Do not take oral diabetes medicines (pills) the morning of surgery.   DO NOT TAKE THE FOLLOWING 7 DAYS PRIOR TO SURGERY: Ozempic, Wegovy, Rybelsus (Semaglutide), Byetta (exenatide), Bydureon (exenatide ER), Victoza, Saxenda (liraglutide), or Trulicity (dulaglutide) Mounjaro (Tirzepatide) Adlyxin (Lixisenatide), Polyethylene Glycol Loxenatide.  Reviewed and Endorsed by [facility] Patient Education Committee, August 2015                              You may not have any metal on your body including hair pins, jewelry, and body piercing             Do not wear make-up, lotions, powders, perfumes or deodorant  Do not wear nail polish including gel and S&S, artificial/acrylic nails, or any other type of covering on natural nails including finger and toenails. If you have artificial nails, gel coating, etc. that needs to be removed by a nail salon please have this removed prior to surgery or surgery may need to be canceled/ delayed if the surgeon/ anesthesia feels like they are unable to be safely monitored.   Do not shave  48 hours prior to surgery.    Do not bring valuables to the hospital. [facility] IS NOT RESPONSIBLE   FOR VALUABLES.  Contacts, dentures or bridgework may not be worn into surgery.   Bring small overnight bag day of surgery.   DO NOT BRING YOUR HOME MEDICATIONS TO THE HOSPITAL. PHARMACY WILL DISPENSE MEDICATIONS LISTED ON YOUR MEDICATION LIST TO YOU DURING YOUR ADMISSION IN THE HOSPITAL!                Please read over the  following fact sheets you were given: IF YOU HAVE QUESTIONS ABOUT YOUR PRE-OP INSTRUCTIONS PLEASE CALL [phone] G[facility]   If you received a COVID test during your pre-op visit  it is requested that you wear a mask when out in public, stay away from anyone that may not be feeling well and notify your surgeon if you develop symptoms. If you test positive for Covid or have been in contact with anyone that has tested positive in the last 10 days please notify you surgeon.    [facility]- Preparing for Total Shoulder Arthroplasty    Before surgery, you can play an important role. Because skin is not sterile, your skin needs to be as free of germs as possible. You can reduce the number of germs on your skin by using the following products. Benzoyl Peroxide Gel Reduces the number of germs present on the skin Applied twice a day to shoulder area starting two days before surgery    ==================================================================  Please follow these instructions carefully:  BENZOYL PEROXIDE 5% GEL  Please do not use if you have an allergy to benzoyl peroxide.   If your skin becomes reddened/irritated stop using the benzoyl peroxide.  Starting two days before surgery, apply as follows: Apply benzoyl peroxide in the morning and at night. Apply after taking a shower. If you are not taking a shower clean entire shoulder front, back, and side along with the armpit with a clean wet washcloth.  Place a quarter-sized dollop on your shoulder and rub in thoroughly, making sure to cover the front, back, and side of your shoulder, along with the armpit.   2 days before ____ AM   ____ PM              1 day before ____ AM   ____ PM                         Do this twice a day for two days.  (Last application is the night before surgery, AFTER using the CHG soap as described below).  Do NOT apply benzoyl peroxide gel on the day of surgery.    Pre-operative 5 CHG Bath Instructions   You  can play a key role in reducing the risk of infection after surgery. Your skin needs to be as free of germs as possible. You can reduce the number of germs on your skin by washing with CHG (chlorhexidine  gluconate) soap before surgery. CHG is an antiseptic soap that kills germs and continues to kill germs even after washing.   DO NOT use if you have an allergy to chlorhexidine /CHG or antibacterial soaps. If your skin becomes reddened or irritated, stop using the CHG and notify one of our RNs at [phone].   Please shower with the CHG soap starting 4 days before surgery using the following schedule:     Please keep in mind the following:  DO NOT shave, including legs and underarms, starting the day of your first shower.   You may shave your face at any point before/day of surgery.  Place clean  sheets on your bed the day you start using CHG soap. Use a clean washcloth (not used since being washed) for each shower. DO NOT sleep with pets once you start using the CHG.   CHG Shower Instructions:  If you choose to wash your hair and private area, wash first with your normal shampoo/soap.  After you use shampoo/soap, rinse your hair and body thoroughly to remove shampoo/soap residue.  Turn the water OFF and apply about 3 tablespoons (45 ml) of CHG soap to a CLEAN washcloth.  Apply CHG soap ONLY FROM YOUR NECK DOWN TO YOUR TOES (washing for 3-5 minutes)  DO NOT use CHG soap on face, private areas, open wounds, or sores.  Pay special attention to the area where your surgery is being performed.  If you are having back surgery, having someone wash your back for you may be helpful. Wait 2 minutes after CHG soap is applied, then you may rinse off the CHG soap.  Pat dry with a clean towel  Put on clean clothes/pajamas   If you choose to wear lotion, please use ONLY the CHG-compatible lotions on the back of this paper.     Additional instructions for the day of surgery: DO NOT APPLY any lotions,  deodorants, cologne, or perfumes.   Put on clean/comfortable clothes.  Brush your teeth.  Ask your nurse before applying any prescription medications to the skin.      CHG Compatible Lotions   Aveeno Moisturizing lotion  Cetaphil Moisturizing Cream  Cetaphil Moisturizing Lotion  Clairol Herbal Essence Moisturizing Lotion, Dry Skin  Clairol Herbal Essence Moisturizing Lotion, Extra Dry Skin  Clairol Herbal Essence Moisturizing Lotion, Normal Skin  Curel Age Defying Therapeutic Moisturizing Lotion with Alpha Hydroxy  Curel Extreme Care Body Lotion  Curel Soothing Hands Moisturizing Hand Lotion  Curel Therapeutic Moisturizing Cream, Fragrance-Free  Curel Therapeutic Moisturizing Lotion, Fragrance-Free  Curel Therapeutic Moisturizing Lotion, Original Formula  Eucerin Daily Replenishing Lotion  Eucerin Dry Skin Therapy Plus Alpha Hydroxy Crme  Eucerin Dry Skin Therapy Plus Alpha Hydroxy Lotion  Eucerin Original Crme  Eucerin Original Lotion  Eucerin Plus Crme Eucerin Plus Lotion  Eucerin TriLipid Replenishing Lotion  Keri Anti-Bacterial Hand Lotion  Keri Deep Conditioning Original Lotion Dry Skin Formula Softly Scented  Keri Deep Conditioning Original Lotion, Fragrance Free Sensitive Skin Formula  Keri Lotion Fast Absorbing Fragrance Free Sensitive Skin Formula  Keri Lotion Fast Absorbing Softly Scented Dry Skin Formula  Keri Original Lotion  Keri Skin Renewal Lotion Keri Silky Smooth Lotion  Keri Silky Smooth Sensitive Skin Lotion  Nivea Body Creamy Conditioning Oil  Nivea Body Extra Enriched Teacher, adult education Moisturizing Lotion Nivea Crme  Nivea Skin Firming Lotion  NutraDerm 30 Skin Lotion  NutraDerm Skin Lotion  NutraDerm Therapeutic Skin Cream  NutraDerm Therapeutic Skin Lotion  ProShield Protective Hand Cream  Provon moisturizing lotion   PATIENT SIGNATURE_________________________________  NURSE  SIGNATURE__________________________________  ________________________________________________________________________

## 2023-12-25 NOTE — Progress Notes (Addendum)
 Date of COVID positive in last 90 days:  No  PCP - Lequita Flor, MD (office note in [facility]) Cardiologist - N/A  Chest x-ray -  N/A EKG - 10-16-23 copy in media Stress Test - Yes ECHO -  N/A Cardiac Cath -  N/A Pacemaker/ICD device last checked: N/A Spinal Cord Stimulator: N/A  Bowel Prep -  N/A  Sleep Study - Yes, +sleep apnea CPAP - No  Fasting Blood Sugar - 120 range Checks Blood Sugar - once a week   Last dose of GLP1 agonist-  N/A GLP1 instructions:  Do not take after     Last dose of SGLT-2 inhibitors-  N/A SGLT-2 instructions:  Do not take after    Blood Thinner Instructions:  N/A Aspirin Instructions: Last Dose:  Activity level:  Can go up a flight of stairs and perform activities of daily living without stopping and without symptoms of chest pain or shortness of breath.  Anesthesia review:  BP elevated at PAT 185/101 and on recheck 185/82.  Patient denied headache, chest pain or SOB.  Has not taken BP med today, will take when she gets home and is able to monitor her BP at home.  Patient denies shortness of breath, fever, cough and chest pain at PAT appointment  Patient verbalized understanding of instructions that were given to them at the PAT appointment. Patient was also instructed that they will need to review over the PAT instructions again at home before surgery.

## 2023-12-26 ENCOUNTER — Other Ambulatory Visit: Payer: Self-pay

## 2023-12-26 ENCOUNTER — Encounter (HOSPITAL_COMMUNITY): Payer: Self-pay

## 2023-12-26 ENCOUNTER — Encounter (HOSPITAL_COMMUNITY)
Admission: RE | Admit: 2023-12-26 | Discharge: 2023-12-26 | Disposition: A | Discharge status: Home / Self Care | Payer: Worker's Compensation | Source: Ambulatory Visit | Attending: Orthopedic Surgery | Admitting: Orthopedic Surgery

## 2023-12-26 VITALS — BP 185/82 | HR 76 | Temp 98.6°F | Resp 18 | Ht 62.0 in | Wt 246.6 lb

## 2023-12-26 DIAGNOSIS — I10 Essential (primary) hypertension: Secondary | ICD-10-CM | POA: Diagnosis not present

## 2023-12-26 DIAGNOSIS — E119 Type 2 diabetes mellitus without complications: Secondary | ICD-10-CM | POA: Insufficient documentation

## 2023-12-26 DIAGNOSIS — Z01812 Encounter for preprocedural laboratory examination: Secondary | ICD-10-CM | POA: Insufficient documentation

## 2023-12-26 DIAGNOSIS — Z01818 Encounter for other preprocedural examination: Secondary | ICD-10-CM

## 2023-12-26 HISTORY — DX: Anxiety disorder, unspecified: F41.9

## 2023-12-26 HISTORY — DX: Unspecified malignant neoplasm of skin of other part of trunk: C44.509

## 2023-12-26 HISTORY — DX: Unspecified osteoarthritis, unspecified site: M19.90

## 2023-12-26 HISTORY — DX: Sleep apnea, unspecified: G47.30

## 2023-12-26 HISTORY — DX: Personal history of urinary calculi: Z87.442

## 2023-12-26 LAB — BASIC METABOLIC PANEL WITH GFR
Anion gap: 11 (ref 5–15)
BUN: 22 mg/dL (ref 8–23)
CO2: 25 mmol/L (ref 22–32)
Calcium: 9 mg/dL (ref 8.9–10.3)
Chloride: 105 mmol/L (ref 98–111)
Creatinine, Ser: 1.07 mg/dL — ABNORMAL HIGH (ref 0.44–1.00)
GFR, Estimated: 53 mL/min — ABNORMAL LOW (ref >60)
Glucose, Bld: 94 mg/dL (ref 70–99)
Potassium: 4.1 mmol/L (ref 3.5–5.1)
Sodium: 141 mmol/L (ref 135–145)

## 2023-12-26 LAB — CBC
HCT: 34.7 % — ABNORMAL LOW (ref 36.0–46.0)
Hemoglobin: 10.4 g/dL — ABNORMAL LOW (ref 12.0–15.0)
MCH: 27.4 pg (ref 26.0–34.0)
MCHC: 30 g/dL (ref 30.0–36.0)
MCV: 91.6 fL (ref 80.0–100.0)
Platelets: 224 10*3/uL (ref 150–400)
RBC: 3.79 MIL/uL — ABNORMAL LOW (ref 3.87–5.11)
RDW: 14.4 % (ref 11.5–15.5)
WBC: 6.4 10*3/uL (ref 4.0–10.5)
nRBC: 0 % (ref 0.0–0.2)

## 2023-12-26 LAB — HEMOGLOBIN A1C
Hgb A1c MFr Bld: 6.4 % — ABNORMAL HIGH (ref 4.8–5.6)
Mean Plasma Glucose: 136.98 mg/dL

## 2023-12-26 LAB — GLUCOSE, CAPILLARY: Glucose-Capillary: 86 mg/dL (ref 70–99)

## 2023-12-30 LAB — SURGICAL PCR SCREEN
MRSA, PCR: POSITIVE — AB
Staphylococcus aureus: POSITIVE — AB

## 2023-12-30 NOTE — Progress Notes (Signed)
PCR results sent to Dr. Norris to review. 

## 2024-01-02 ENCOUNTER — Ambulatory Visit (HOSPITAL_COMMUNITY): Payer: Worker's Compensation | Admitting: Anesthesiology

## 2024-01-02 ENCOUNTER — Other Ambulatory Visit (HOSPITAL_BASED_OUTPATIENT_CLINIC_OR_DEPARTMENT_OTHER): Payer: Self-pay

## 2024-01-02 ENCOUNTER — Other Ambulatory Visit: Payer: Self-pay

## 2024-01-02 ENCOUNTER — Encounter (HOSPITAL_COMMUNITY): Payer: Self-pay | Admitting: Orthopedic Surgery

## 2024-01-02 ENCOUNTER — Ambulatory Visit (HOSPITAL_COMMUNITY)

## 2024-01-02 ENCOUNTER — Encounter (HOSPITAL_COMMUNITY)
Admission: RE | Disposition: A | Discharge status: Home / Self Care | Payer: Self-pay | Source: Ambulatory Visit | Attending: Orthopedic Surgery

## 2024-01-02 ENCOUNTER — Observation Stay (HOSPITAL_COMMUNITY)
Admission: RE | Admit: 2024-01-02 | Discharge: 2024-01-03 | Disposition: A | Discharge status: Home / Self Care | Payer: Worker's Compensation | Source: Ambulatory Visit | Attending: Orthopedic Surgery | Admitting: Orthopedic Surgery

## 2024-01-02 ENCOUNTER — Ambulatory Visit (HOSPITAL_COMMUNITY): Payer: Worker's Compensation | Admitting: Physician Assistant

## 2024-01-02 ENCOUNTER — Other Ambulatory Visit (HOSPITAL_COMMUNITY): Payer: Self-pay

## 2024-01-02 DIAGNOSIS — Z85828 Personal history of other malignant neoplasm of skin: Secondary | ICD-10-CM | POA: Insufficient documentation

## 2024-01-02 DIAGNOSIS — Z6841 Body Mass Index (BMI) 40.0 and over, adult: Secondary | ICD-10-CM | POA: Diagnosis not present

## 2024-01-02 DIAGNOSIS — M75101 Unspecified rotator cuff tear or rupture of right shoulder, not specified as traumatic: Secondary | ICD-10-CM | POA: Diagnosis present

## 2024-01-02 DIAGNOSIS — Z79899 Other long term (current) drug therapy: Secondary | ICD-10-CM | POA: Insufficient documentation

## 2024-01-02 DIAGNOSIS — I10 Essential (primary) hypertension: Secondary | ICD-10-CM

## 2024-01-02 DIAGNOSIS — Z96611 Presence of right artificial shoulder joint: Principal | ICD-10-CM

## 2024-01-02 DIAGNOSIS — E119 Type 2 diabetes mellitus without complications: Secondary | ICD-10-CM

## 2024-01-02 HISTORY — PX: REVERSE SHOULDER ARTHROPLASTY: SHX5054

## 2024-01-02 LAB — GLUCOSE, CAPILLARY
Glucose-Capillary: 116 mg/dL — ABNORMAL HIGH (ref 70–99)
Glucose-Capillary: 133 mg/dL — ABNORMAL HIGH (ref 70–99)
Glucose-Capillary: 215 mg/dL — ABNORMAL HIGH (ref 70–99)

## 2024-01-02 SURGERY — ARTHROPLASTY, SHOULDER, TOTAL, REVERSE
Anesthesia: General | Site: Shoulder | Laterality: Right

## 2024-01-02 MED ORDER — LINAGLIPTIN 5 MG PO TABS
5.0000 mg | ORAL_TABLET | Freq: Every day | ORAL | Status: DC
  Administered 2024-01-03: 5 mg via ORAL
  Filled 2024-01-02: qty 1

## 2024-01-02 MED ORDER — LIDOCAINE HCL (PF) 2 % IJ SOLN
INTRAMUSCULAR | Status: DC | PRN
  Administered 2024-01-02: 40 mg via INTRADERMAL

## 2024-01-02 MED ORDER — ROCURONIUM BROMIDE 10 MG/ML (PF) SYRINGE
PREFILLED_SYRINGE | INTRAVENOUS | Status: DC | PRN
  Administered 2024-01-02: 30 mg via INTRAVENOUS

## 2024-01-02 MED ORDER — MUPIROCIN 2 % EX OINT
1.0000 | TOPICAL_OINTMENT | Freq: Two times a day (BID) | CUTANEOUS | 0 refills | Status: AC
  Filled 2024-01-02: qty 66, 30d supply, fill #0

## 2024-01-02 MED ORDER — DOCUSATE SODIUM 100 MG PO CAPS
100.0000 mg | ORAL_CAPSULE | Freq: Two times a day (BID) | ORAL | Status: DC
  Administered 2024-01-02 – 2024-01-03 (×2): 100 mg via ORAL
  Filled 2024-01-02 (×2): qty 1

## 2024-01-02 MED ORDER — CEFAZOLIN SODIUM-DEXTROSE 2-4 GM/100ML-% IV SOLN
2.0000 g | INTRAVENOUS | Status: DC
  Filled 2024-01-02: qty 100

## 2024-01-02 MED ORDER — ONDANSETRON HCL 4 MG/2ML IJ SOLN
INTRAMUSCULAR | Status: DC | PRN
  Administered 2024-01-02: 4 mg via INTRAVENOUS

## 2024-01-02 MED ORDER — MECLIZINE HCL 25 MG PO TABS: 25.0000 mg | ORAL_TABLET | Freq: Two times a day (BID) | ORAL | Status: DC | PRN

## 2024-01-02 MED ORDER — METHOCARBAMOL 500 MG PO TABS
500.0000 mg | ORAL_TABLET | Freq: Four times a day (QID) | ORAL | Status: DC | PRN
  Administered 2024-01-03: 500 mg via ORAL
  Filled 2024-01-02 (×2): qty 1

## 2024-01-02 MED ORDER — INSULIN ASPART 100 UNIT/ML IJ SOLN
6.0000 [IU] | Freq: Three times a day (TID) | INTRAMUSCULAR | Status: DC
  Administered 2024-01-03: 6 [IU] via SUBCUTANEOUS

## 2024-01-02 MED ORDER — PHENYLEPHRINE HCL-NACL 20-0.9 MG/250ML-% IV SOLN
INTRAVENOUS | Status: DC | PRN
  Administered 2024-01-02: 35 ug/min via INTRAVENOUS

## 2024-01-02 MED ORDER — FENTANYL CITRATE PF 50 MCG/ML IJ SOSY
50.0000 ug | PREFILLED_SYRINGE | Freq: Once | INTRAMUSCULAR | Status: AC
  Administered 2024-01-02: 50 ug via INTRAVENOUS
  Filled 2024-01-02: qty 2

## 2024-01-02 MED ORDER — ONDANSETRON HCL 4 MG/2ML IJ SOLN
INTRAMUSCULAR | Status: AC
  Filled 2024-01-02: qty 2

## 2024-01-02 MED ORDER — MIDAZOLAM HCL 2 MG/2ML IJ SOLN: 1.0000 mg | Freq: Once | INTRAMUSCULAR | Status: DC

## 2024-01-02 MED ORDER — FENTANYL CITRATE PF 50 MCG/ML IJ SOSY
25.0000 ug | PREFILLED_SYRINGE | INTRAMUSCULAR | Status: DC | PRN
  Administered 2024-01-02 (×2): 50 ug via INTRAVENOUS

## 2024-01-02 MED ORDER — MONTELUKAST SODIUM 10 MG PO TABS
10.0000 mg | ORAL_TABLET | Freq: Every day | ORAL | Status: DC
  Administered 2024-01-02: 10 mg via ORAL
  Filled 2024-01-02: qty 1

## 2024-01-02 MED ORDER — GLYCOPYRROLATE 0.2 MG/ML IJ SOLN
INTRAMUSCULAR | Status: AC
  Filled 2024-01-02: qty 1

## 2024-01-02 MED ORDER — IRBESARTAN-HYDROCHLOROTHIAZIDE 150-12.5 MG PO TABS: 1.0000 | ORAL_TABLET | Freq: Every day | ORAL | Status: DC

## 2024-01-02 MED ORDER — ROCURONIUM BROMIDE 10 MG/ML (PF) SYRINGE
PREFILLED_SYRINGE | INTRAVENOUS | Status: AC
  Filled 2024-01-02: qty 10

## 2024-01-02 MED ORDER — THROMBIN (RECOMBINANT) 5000 UNITS EX SOLR
CUTANEOUS | Status: AC
  Filled 2024-01-02: qty 5000

## 2024-01-02 MED ORDER — FENTANYL CITRATE (PF) 100 MCG/2ML IJ SOLN
INTRAMUSCULAR | Status: DC | PRN
  Administered 2024-01-02 (×2): 50 ug via INTRAVENOUS

## 2024-01-02 MED ORDER — ALBUTEROL SULFATE HFA 108 (90 BASE) MCG/ACT IN AERS: 1.0000 | INHALATION_SPRAY | Freq: Four times a day (QID) | RESPIRATORY_TRACT | Status: DC | PRN

## 2024-01-02 MED ORDER — OXYCODONE HCL 5 MG/5ML PO SOLN: 5.0000 mg | Freq: Once | ORAL | Status: DC | PRN

## 2024-01-02 MED ORDER — ONDANSETRON HCL 4 MG PO TABS: 4.0000 mg | ORAL_TABLET | Freq: Four times a day (QID) | ORAL | Status: DC | PRN

## 2024-01-02 MED ORDER — ONDANSETRON HCL 4 MG/2ML IJ SOLN: 4.0000 mg | Freq: Once | INTRAMUSCULAR | Status: DC | PRN

## 2024-01-02 MED ORDER — HYDROCHLOROTHIAZIDE 12.5 MG PO TABS
12.5000 mg | ORAL_TABLET | Freq: Every day | ORAL | Status: DC
  Administered 2024-01-02 – 2024-01-03 (×2): 12.5 mg via ORAL
  Filled 2024-01-02 (×2): qty 1

## 2024-01-02 MED ORDER — FUROSEMIDE 20 MG PO TABS: 20.0000 mg | ORAL_TABLET | Freq: Every day | ORAL | Status: DC | PRN

## 2024-01-02 MED ORDER — SODIUM CHLORIDE 0.9% FLUSH
3.0000 mL | Freq: Two times a day (BID) | INTRAVENOUS | Status: DC
  Administered 2024-01-02: 10 mL via INTRAVENOUS

## 2024-01-02 MED ORDER — EPHEDRINE SULFATE-NACL 50-0.9 MG/10ML-% IV SOSY
PREFILLED_SYRINGE | INTRAVENOUS | Status: DC | PRN
  Administered 2024-01-02 (×2): 5 mg via INTRAVENOUS

## 2024-01-02 MED ORDER — 0.9 % SODIUM CHLORIDE (POUR BTL) OPTIME
TOPICAL | Status: DC | PRN
  Administered 2024-01-02: 1000 mL

## 2024-01-02 MED ORDER — FLUTICASONE FUROATE-VILANTEROL 200-25 MCG/ACT IN AEPB
1.0000 | INHALATION_SPRAY | Freq: Every day | RESPIRATORY_TRACT | Status: DC
  Administered 2024-01-03: 1 via RESPIRATORY_TRACT
  Filled 2024-01-02: qty 28

## 2024-01-02 MED ORDER — THROMBIN (RECOMBINANT) 5000 UNITS EX SOLR
CUTANEOUS | Status: DC | PRN
  Administered 2024-01-02: 5000 [IU] via TOPICAL

## 2024-01-02 MED ORDER — BUPIVACAINE LIPOSOME 1.3 % IJ SUSP
INTRAMUSCULAR | Status: DC | PRN
  Administered 2024-01-02: 10 mL via PERINEURAL

## 2024-01-02 MED ORDER — METOCLOPRAMIDE HCL 5 MG PO TABS: 5.0000 mg | ORAL_TABLET | Freq: Three times a day (TID) | ORAL | Status: DC | PRN

## 2024-01-02 MED ORDER — SODIUM CHLORIDE 0.9% FLUSH: 3.0000 mL | INTRAVENOUS | Status: DC | PRN

## 2024-01-02 MED ORDER — TRANEXAMIC ACID-NACL 1000-0.7 MG/100ML-% IV SOLN
1000.0000 mg | Freq: Once | INTRAVENOUS | Status: AC
  Administered 2024-01-02: 1000 mg via INTRAVENOUS
  Filled 2024-01-02: qty 100

## 2024-01-02 MED ORDER — OXYCODONE HCL 5 MG PO TABS: 5.0000 mg | ORAL_TABLET | Freq: Once | ORAL | Status: DC | PRN

## 2024-01-02 MED ORDER — BUPIVACAINE HCL (PF) 0.5 % IJ SOLN
INTRAMUSCULAR | Status: DC | PRN
  Administered 2024-01-02: 15 mL via PERINEURAL

## 2024-01-02 MED ORDER — ACETAMINOPHEN 325 MG PO TABS
325.0000 mg | ORAL_TABLET | Freq: Four times a day (QID) | ORAL | Status: DC | PRN
  Filled 2024-01-02: qty 2

## 2024-01-02 MED ORDER — FENTANYL CITRATE PF 50 MCG/ML IJ SOSY
PREFILLED_SYRINGE | INTRAMUSCULAR | Status: AC
  Filled 2024-01-02: qty 1

## 2024-01-02 MED ORDER — HYDROCODONE-ACETAMINOPHEN 5-325 MG PO TABS
1.0000 | ORAL_TABLET | ORAL | Status: DC | PRN
  Filled 2024-01-02: qty 2

## 2024-01-02 MED ORDER — EZETIMIBE 10 MG PO TABS
10.0000 mg | ORAL_TABLET | Freq: Every day | ORAL | Status: DC
  Administered 2024-01-02 – 2024-01-03 (×2): 10 mg via ORAL
  Filled 2024-01-02 (×2): qty 1

## 2024-01-02 MED ORDER — TRANEXAMIC ACID-NACL 1000-0.7 MG/100ML-% IV SOLN
1000.0000 mg | INTRAVENOUS | Status: AC
  Administered 2024-01-02: 1000 mg via INTRAVENOUS
  Filled 2024-01-02: qty 100

## 2024-01-02 MED ORDER — HYDROCODONE-ACETAMINOPHEN 5-325 MG PO TABS
1.0000 | ORAL_TABLET | Freq: Four times a day (QID) | ORAL | 0 refills | Status: AC | PRN
  Filled 2024-01-02: qty 30, 7d supply, fill #0

## 2024-01-02 MED ORDER — ONDANSETRON HCL 4 MG/2ML IJ SOLN: 4.0000 mg | Freq: Four times a day (QID) | INTRAMUSCULAR | Status: DC | PRN

## 2024-01-02 MED ORDER — INSULIN ASPART 100 UNIT/ML IJ SOLN
0.0000 [IU] | Freq: Three times a day (TID) | INTRAMUSCULAR | Status: DC
  Administered 2024-01-03: 4 [IU] via SUBCUTANEOUS

## 2024-01-02 MED ORDER — LIDOCAINE HCL (PF) 2 % IJ SOLN
INTRAMUSCULAR | Status: AC
  Filled 2024-01-02: qty 5

## 2024-01-02 MED ORDER — PROPOFOL 10 MG/ML IV BOLUS
INTRAVENOUS | Status: AC
  Filled 2024-01-02: qty 20

## 2024-01-02 MED ORDER — MENTHOL 3 MG MT LOZG: 1.0000 | LOZENGE | OROMUCOSAL | Status: DC | PRN

## 2024-01-02 MED ORDER — FENTANYL CITRATE (PF) 100 MCG/2ML IJ SOLN
INTRAMUSCULAR | Status: AC
  Filled 2024-01-02: qty 2

## 2024-01-02 MED ORDER — BUPIVACAINE-EPINEPHRINE (PF) 0.25% -1:200000 IJ SOLN
INTRAMUSCULAR | Status: DC | PRN
  Administered 2024-01-02: 20 mL

## 2024-01-02 MED ORDER — VANCOMYCIN HCL 1500 MG/300ML IV SOLN
1500.0000 mg | INTRAVENOUS | Status: AC
  Administered 2024-01-02: 1500 mg via INTRAVENOUS
  Filled 2024-01-02: qty 300

## 2024-01-02 MED ORDER — METHOCARBAMOL 1000 MG/10ML IJ SOLN: 500.0000 mg | Freq: Four times a day (QID) | INTRAMUSCULAR | Status: DC | PRN

## 2024-01-02 MED ORDER — PROPOFOL 10 MG/ML IV BOLUS
INTRAVENOUS | Status: DC | PRN
  Administered 2024-01-02: 100 mg via INTRAVENOUS

## 2024-01-02 MED ORDER — CHLORHEXIDINE GLUCONATE 4 % EX SOLN
1.0000 | CUTANEOUS | 1 refills | Status: AC
  Filled 2024-01-02: qty 946, 42d supply, fill #0

## 2024-01-02 MED ORDER — BUPIVACAINE-EPINEPHRINE (PF) 0.25% -1:200000 IJ SOLN
INTRAMUSCULAR | Status: AC
  Filled 2024-01-02: qty 30

## 2024-01-02 MED ORDER — SUGAMMADEX SODIUM 200 MG/2ML IV SOLN
INTRAVENOUS | Status: DC | PRN
  Administered 2024-01-02: 230 mg via INTRAVENOUS

## 2024-01-02 MED ORDER — VANCOMYCIN HCL IN DEXTROSE 1-5 GM/200ML-% IV SOLN
1000.0000 mg | Freq: Two times a day (BID) | INTRAVENOUS | Status: AC
  Administered 2024-01-02: 1000 mg via INTRAVENOUS
  Filled 2024-01-02: qty 200

## 2024-01-02 MED ORDER — INSULIN ASPART 100 UNIT/ML IJ SOLN: 0.0000 [IU] | INTRAMUSCULAR | Status: DC | PRN

## 2024-01-02 MED ORDER — ACETAMINOPHEN 500 MG PO TABS
1000.0000 mg | ORAL_TABLET | Freq: Once | ORAL | Status: AC
  Administered 2024-01-02: 1000 mg via ORAL
  Filled 2024-01-02: qty 2

## 2024-01-02 MED ORDER — INSULIN ASPART 100 UNIT/ML IJ SOLN
0.0000 [IU] | Freq: Every day | INTRAMUSCULAR | Status: DC
  Administered 2024-01-02: 2 [IU] via SUBCUTANEOUS

## 2024-01-02 MED ORDER — EPHEDRINE 5 MG/ML INJ
INTRAVENOUS | Status: AC
  Filled 2024-01-02: qty 5

## 2024-01-02 MED ORDER — POLYETHYLENE GLYCOL 3350 17 G PO PACK: 17.0000 g | PACK | Freq: Every day | ORAL | Status: DC | PRN

## 2024-01-02 MED ORDER — PHENOL 1.4 % MT LIQD: 1.0000 | OROMUCOSAL | Status: DC | PRN

## 2024-01-02 MED ORDER — STERILE WATER FOR IRRIGATION IR SOLN
Status: DC | PRN
  Administered 2024-01-02: 1000 mL

## 2024-01-02 MED ORDER — MAGNESIUM 200 MG PO TABS: 200.0000 mg | ORAL_TABLET | Freq: Every day | ORAL | Status: DC

## 2024-01-02 MED ORDER — METOCLOPRAMIDE HCL 5 MG/ML IJ SOLN: 5.0000 mg | Freq: Three times a day (TID) | INTRAMUSCULAR | Status: DC | PRN

## 2024-01-02 MED ORDER — CHLORHEXIDINE GLUCONATE 0.12 % MT SOLN
15.0000 mL | Freq: Once | OROMUCOSAL | Status: AC
  Administered 2024-01-02: 15 mL via OROMUCOSAL

## 2024-01-02 MED ORDER — IRBESARTAN 150 MG PO TABS
150.0000 mg | ORAL_TABLET | Freq: Every day | ORAL | Status: DC
  Administered 2024-01-02 – 2024-01-03 (×2): 150 mg via ORAL
  Filled 2024-01-02 (×2): qty 1

## 2024-01-02 MED ORDER — PANTOPRAZOLE SODIUM 40 MG PO TBEC
40.0000 mg | DELAYED_RELEASE_TABLET | Freq: Every day | ORAL | Status: DC
  Administered 2024-01-02 – 2024-01-03 (×2): 40 mg via ORAL
  Filled 2024-01-02 (×2): qty 1

## 2024-01-02 MED ORDER — DEXAMETHASONE SODIUM PHOSPHATE 10 MG/ML IJ SOLN
INTRAMUSCULAR | Status: AC
  Filled 2024-01-02: qty 1

## 2024-01-02 MED ORDER — ORAL CARE MOUTH RINSE: 15.0000 mL | Freq: Once | OROMUCOSAL | Status: AC

## 2024-01-02 MED ORDER — DEXAMETHASONE SODIUM PHOSPHATE 10 MG/ML IJ SOLN
INTRAMUSCULAR | Status: DC | PRN
  Administered 2024-01-02: 10 mg via INTRAVENOUS

## 2024-01-02 MED ORDER — MORPHINE SULFATE (PF) 2 MG/ML IV SOLN: 0.5000 mg | INTRAVENOUS | Status: DC | PRN

## 2024-01-02 MED ORDER — ALBUTEROL SULFATE (2.5 MG/3ML) 0.083% IN NEBU: 2.5000 mg | INHALATION_SOLUTION | Freq: Four times a day (QID) | RESPIRATORY_TRACT | Status: DC | PRN

## 2024-01-02 MED ORDER — ESCITALOPRAM OXALATE 20 MG PO TABS
20.0000 mg | ORAL_TABLET | Freq: Every day | ORAL | Status: DC
  Administered 2024-01-02 – 2024-01-03 (×2): 20 mg via ORAL
  Filled 2024-01-02 (×2): qty 1

## 2024-01-02 MED ORDER — LACTATED RINGERS IV SOLN: INTRAVENOUS | Status: DC

## 2024-01-02 SURGICAL SUPPLY — 61 items
BAG COUNTER SPONGE SURGICOUNT (BAG) IMPLANT
BAG ZIPLOCK 12X15 (MISCELLANEOUS) IMPLANT
BIT DRILL 1.6MX128 (BIT) IMPLANT
BIT DRILL 170X2.5X (BIT) IMPLANT
BLADE SAG 18X100X1.27 (BLADE) ×2 IMPLANT
CONTROL EPI SZ1 (Orthopedic Implant) IMPLANT
COVER BACK TABLE 60X90IN (DRAPES) ×2 IMPLANT
COVER SURGICAL LIGHT HANDLE (MISCELLANEOUS) ×2 IMPLANT
DRAPE INCISE IOBAN 66X45 STRL (DRAPES) ×2 IMPLANT
DRAPE SHEET LG 3/4 BI-LAMINATE (DRAPES) ×2 IMPLANT
DRAPE SURG ORHT 6 SPLT 77X108 (DRAPES) ×4 IMPLANT
DRAPE TOP 10253 STERILE (DRAPES) ×2 IMPLANT
DRAPE U-SHAPE 47X51 STRL (DRAPES) ×2 IMPLANT
DRSG ADAPTIC 3X8 NADH LF (GAUZE/BANDAGES/DRESSINGS) ×2 IMPLANT
DRSG AQUACEL AG ADV 3.5X10 (GAUZE/BANDAGES/DRESSINGS) ×2 IMPLANT
DURAPREP 26ML APPLICATOR (WOUND CARE) ×2 IMPLANT
ELECT BLADE TIP CTD 4 INCH (ELECTRODE) ×2 IMPLANT
ELECT NDL TIP 2.8 STRL (NEEDLE) ×2 IMPLANT
ELECT NEEDLE TIP 2.8 STRL (NEEDLE) ×1 IMPLANT
ELECT PENCIL ROCKER SW 15FT (MISCELLANEOUS) ×2 IMPLANT
ELECT REM PT RETURN 15FT ADLT (MISCELLANEOUS) ×2 IMPLANT
FACESHIELD WRAPAROUND (MASK) ×1 IMPLANT
FACESHIELD WRAPAROUND OR TEAM (MASK) ×2 IMPLANT
GAUZE PAD ABD 8X10 STRL (GAUZE/BANDAGES/DRESSINGS) ×2 IMPLANT
GAUZE SPONGE 4X4 12PLY STRL (GAUZE/BANDAGES/DRESSINGS) ×2 IMPLANT
GLENOSPHERE DELTA XTEND LAT 38 (Miscellaneous) IMPLANT
GLOVE BIOGEL PI IND STRL 7.5 (GLOVE) ×2 IMPLANT
GLOVE BIOGEL PI IND STRL 8.5 (GLOVE) ×2 IMPLANT
GLOVE ORTHO TXT STRL SZ7.5 (GLOVE) ×2 IMPLANT
GLOVE SURG ORTHO 8.5 STRL (GLOVE) ×2 IMPLANT
GOWN STRL REUS W/ TWL XL LVL3 (GOWN DISPOSABLE) ×4 IMPLANT
KIT BASIN OR (CUSTOM PROCEDURE TRAY) ×2 IMPLANT
KIT TURNOVER KIT A (KITS) ×2 IMPLANT
MANIFOLD NEPTUNE II (INSTRUMENTS) ×2 IMPLANT
METAGLENE DELTA EXTEND (Trauma) IMPLANT
NDL MAYO CATGUT SZ4 TPR NDL (NEEDLE) IMPLANT
NEEDLE MAYO CATGUT SZ4 (NEEDLE) IMPLANT
NS IRRIG 1000ML POUR BTL (IV SOLUTION) ×2 IMPLANT
PACK SHOULDER (CUSTOM PROCEDURE TRAY) ×2 IMPLANT
PIN GUIDE 1.2 (PIN) IMPLANT
PIN GUIDE GLENOPHERE 1.5MX300M (PIN) IMPLANT
PIN METAGLENE 2.5 (PIN) IMPLANT
RESTRAINT HEAD UNIVERSAL NS (MISCELLANEOUS) ×2 IMPLANT
SCREW 4.5X36MM (Screw) IMPLANT
SCREW LOCK 42 (Screw) IMPLANT
SLING ARM FOAM STRAP LRG (SOFTGOODS) IMPLANT
SLING ARM IMMOBILIZER LRG (SOFTGOODS) IMPLANT
SPACER 38 PLUS 3 (Spacer) IMPLANT
SPIKE FLUID TRANSFER (MISCELLANEOUS) ×2 IMPLANT
SPONGE SURGIFOAM ABS GEL 12-7 (HEMOSTASIS) IMPLANT
STEM HUMERAL SZ8 STD (Stem) IMPLANT
STRIP CLOSURE SKIN 1/2X4 (GAUZE/BANDAGES/DRESSINGS) ×2 IMPLANT
SUT MNCRL AB 4-0 PS2 18 (SUTURE) ×2 IMPLANT
SUT VIC AB 0 CT1 36 (SUTURE) ×2 IMPLANT
SUT VIC AB 0 CT2 27 (SUTURE) ×2 IMPLANT
SUT VIC AB 2-0 CT1 TAPERPNT 27 (SUTURE) ×2 IMPLANT
SUTURE FIBERWR #2 38 T-5 BLUE (SUTURE) ×4 IMPLANT
SUTURE FIBERWR#2 38 REV NDL BL (SUTURE) IMPLANT
TOWEL GREEN STERILE FF (TOWEL DISPOSABLE) ×2 IMPLANT
TOWEL OR 17X26 10 PK STRL BLUE (TOWEL DISPOSABLE) ×2 IMPLANT
YANKAUER SUCT BULB TIP NO VENT (SUCTIONS) ×2 IMPLANT

## 2024-01-02 NOTE — Progress Notes (Deleted)
 TOC meds in a secure bag delivered to patient in room  by this RN

## 2024-01-02 NOTE — Anesthesia Preprocedure Evaluation (Addendum)
 Anesthesia Evaluation  Patient identified by MRN, date of birth, ID band Patient awake    Reviewed: Allergy & Precautions, NPO status , Patient's Chart, lab work & pertinent test results  History of Anesthesia Complications (+) PONV and history of anesthetic complications  Airway Mallampati: III  TM Distance: >3 FB Neck ROM: Full    Dental  (+) Dental Advisory Given, Chipped   Pulmonary sleep apnea    Pulmonary exam normal        Cardiovascular hypertension, Pt. on medications Normal cardiovascular exam     Neuro/Psych  PSYCHIATRIC DISORDERS Anxiety Depression    negative neurological ROS     GI/Hepatic Neg liver ROS,GERD  Medicated and Controlled,, Hx gastric bypass    Endo/Other  diabetes, Type 2, Oral Hypoglycemic Agents  Class 3 obesity  Renal/GU negative Renal ROS     Musculoskeletal  (+) Arthritis ,    Abdominal  (+) + obese  Peds  Hematology  (+) Blood dyscrasia, anemia   Anesthesia Other Findings   Reproductive/Obstetrics                             Anesthesia Physical Anesthesia Plan  ASA: 3  Anesthesia Plan: General   Post-op Pain Management: Tylenol  PO (pre-op)* and Regional block*   Induction: Intravenous  PONV Risk Score and Plan: 4 or greater and Treatment may vary due to age or medical condition, Ondansetron  and Propofol  infusion  Airway Management Planned: Oral ETT  Additional Equipment: None  Intra-op Plan:   Post-operative Plan: Extubation in OR  Informed Consent: I have reviewed the patients History and Physical, chart, labs and discussed the procedure including the risks, benefits and alternatives for the proposed anesthesia with the patient or authorized representative who has indicated his/her understanding and acceptance.     Dental advisory given  Plan Discussed with: CRNA and Anesthesiologist  Anesthesia Plan Comments:         Anesthesia Quick Evaluation

## 2024-01-02 NOTE — Op Note (Signed)
 NAME: Lindsey Sanders, Lindsey B. MEDICAL RECORD NO: [id number] ACCOUNT NO: [id number] DATE OF BIRTH: Aug 26, 1945 FACILITY: [facility] LOCATION: [facility] PHYSICIAN: Elspeth SAUNDERS. Kay, MD  Operative Report   DATE OF PROCEDURE: 01/02/2024  PREOPERATIVE DIAGNOSES: 1.  Right shoulder rotator cuff insufficiency. 2.  Right shoulder pain.  POSTOPERATIVE DIAGNOSES: 1.  Right shoulder rotator cuff insufficiency. 2.  Right shoulder pain.  PROCEDURE PERFORMED:  Right reverse total shoulder arthroplasty utilizing DePuy Delta Xtend prosthesis with no subscapularis repair.  ATTENDING SURGEON:  Elspeth SAUNDERS. Kay, MD  ASSISTANT:  Debby Crock [place], [place], who was scrubbed during the entire procedure, and necessary for satisfactory completion of surgery.  ANESTHESIA:  General anesthesia was used plus interscalene block.  ESTIMATED BLOOD LOSS:  250 mL.  FLUID REPLACEMENT:  1000 mL crystalloid.  COUNTS:  Correct.  COMPLICATIONS:  None.  ANTIBIOTICS:  Perioperative antibiotics were given.  INDICATIONS:  The patient is a 78 year old female who presents with a history of worsening right shoulder pain and dysfunction secondary to an injury.  The patient unfortunately continued to have poor function and increasing pain despite conservative  management.  She presents for operative treatment to restore fixed fulcrum mechanics to the shoulder using reverse total shoulder arthroplasty.  The patient fully understands the risks and benefits of surgery and informed consent was obtained.  DESCRIPTION OF PROCEDURE:  After an adequate level of general anesthesia was achieved plus interscalene block, the patient was positioned in the modified beach chair position.  Right shoulder was correctly identified and sterile prep and drape were  performed.  Timeout called verifying correct patient and correct site.  We entered the patient's shoulder using a standard deltopectoral approach starting at the coracoid process and extending down to  the anterior humerus using a 10 blade scalpel  dissection down through the subcutaneous tissues using Bovie.  The cephalic vein was identified and taken laterally with the deltoid.  The pectoralis was identified and retracted medially.  Deep retractor was placed.  The conjoined tendon was also  retracted medially.  We identified the biceps tendon and whipstitched that in situ with a tenodesis, soft tissue tenodesis with 0 Vicryl figure-of-eight suture.  We then released the subscapularis subperiosteally off the lesser tuberosity.  This was  fairly stiff and also somewhat attenuated.  We decided that this would not be repaired, but we tagged it for protection of the axillary nerve.  We then released the inferior capsule progressively externally rotating and extending the shoulder to deliver  the humeral head out of the wound.  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  We released enough rotator cuff to be able to make our head cut.  We then entered the proximal humerus with a 6 mm reamer reaming out to a size 8.  We  placed our 8 mm T-handled guide and then resected the head at 20 degrees of retroversion with the oscillating saw.  We removed some small osteophytes with a rongeur.  We then subluxed the humerus posteriorly, gaining good exposure of the glenoid face.   We removed the capsule and the labrum, getting down to the bone.  We then placed our guide pin centered low bicortically and reamed to subchondral bone for the metaglene baseplate.  Next, we did our peripheral T-handle reamer to clear the bone  superiorly.  We then drilled out our central plug hole.  We irrigated and then impacted the HA-coated press-fit baseplate into position.  We had a 42 screw inferiorly, 36 screws superiorly, and  due to the patient's small anterior-posterior diameter, we  were not able to use anterior or posterior screws.  The baseplate was secured.  We selected a 38+0 standard glenosphere and attached to  the baseplate with the screwdriver.  We then went back to the humeral side.  We reamed for the one centered  metaphysis.  We then used the 8 stem with one centered metaphysis, impacted in 20 degrees of retroversion.  We trialled with a 38 +3 poly trial.  We reduced the shoulder and we were happy with soft tissue tension and balancing and no impingement.  We  removed all trial components, irrigated thoroughly.  I then used available bone graft from the humeral head and using impaction grafting technique, we used a Porocoat 8 stem and the one-centered metaphysis set in the 0 setting and impacted in 20 degrees  of retroversion.  We had very secure stem.  Then we took the real 38+3 poly and impacted that on the humeral tray and reduced the shoulder.  Nice little pop as it reduced and good stability throughout a full arc of motion.  We irrigated thoroughly.  We  then resected the subscapularis remnant.  We then repaired the deltopectoral interval with 0 Vicryl suture followed by 2-0 Vicryl for subcutaneous closure and running 4-0 Monocryl for skin.  Steri-Strips applied followed by a sterile dressing.  The  patient tolerated the surgery well.   PUS D: 01/02/2024 2:56:41 pm T: 01/02/2024 7:56:00 pm  JOB: [id number]/ [id number]

## 2024-01-02 NOTE — Anesthesia Postprocedure Evaluation (Signed)
 Anesthesia Post Note  Patient: Lindsey Sanders  Procedure(s) Performed: ARTHROPLASTY, SHOULDER, TOTAL, REVERSE (Right: Shoulder)     Patient location during evaluation: PACU Anesthesia Type: General Level of consciousness: awake Pain management: pain level controlled Vital Signs Assessment: post-procedure vital signs reviewed and stable Respiratory status: spontaneous breathing, nonlabored ventilation and respiratory function stable Cardiovascular status: blood pressure returned to baseline and stable Postop Assessment: no apparent nausea or vomiting Anesthetic complications: no   No notable events documented.  Last Vitals:  Vitals:   01/02/24 1500 01/02/24 1515  BP: (!) 168/71   Pulse: 71 71  Resp: 10 10  Temp:    SpO2: 96% 97%    Last Pain:  Vitals:   01/02/24 1515  TempSrc:   PainSc: Asleep                 Delon Aisha Arch

## 2024-01-02 NOTE — Discharge Instructions (Signed)
 Ice to the shoulder constantly.  Keep the incision covered and clean and dry for one week, then ok to remove the Aquacel bandage and to get it wet in the shower.  Do exercise as instructed several times per day.  DO NOT reach behind your back or push up out of a chair with the operative arm.  Use a sling while you are up and around for comfort, may remove while seated.  Keep pillow propped behind the operative elbow.  Follow up with Dr Kay in two weeks in the office, call [phone] for appt  Please call Dr Kay (cell) [phone] with any questions or concerns

## 2024-01-02 NOTE — Anesthesia Procedure Notes (Signed)
 Procedure Name: Intubation Date/Time: 01/02/2024 1:16 PM  Performed by: Zulema Leita PARAS, CRNAPre-anesthesia Checklist: Patient identified, Emergency Drugs available, Suction available and Patient being monitored Patient Re-evaluated:Patient Re-evaluated prior to induction Oxygen Delivery Method: Circle system utilized Preoxygenation: Pre-oxygenation with 100% oxygen Induction Type: IV induction Ventilation: Mask ventilation without difficulty Laryngoscope Size: Mac and 4 Grade View: Grade I Tube type: Oral Tube size: 7.0 mm Number of attempts: 1 Airway Equipment and Method: Stylet Placement Confirmation: ETT inserted through vocal cords under direct vision, positive ETCO2 and breath sounds checked- equal and bilateral Secured at: 21 cm Tube secured with: Tape Dental Injury: Teeth and Oropharynx as per pre-operative assessment

## 2024-01-02 NOTE — Interval H&P Note (Signed)
 History and Physical Interval Note:  01/02/2024 11:00 AM  Lindsey Sanders  has presented today for surgery, with the diagnosis of Right shoulder rotator cuff insuffciency.  The various methods of treatment have been discussed with the patient and family. After consideration of risks, benefits and other options for treatment, the patient has consented to  Procedure(s): ARTHROPLASTY, SHOULDER, TOTAL, REVERSE (Right) as a surgical intervention.  The patient's history has been reviewed, patient examined, no change in status, stable for surgery.  I have reviewed the patient's chart and labs.  Questions were answered to the patient's satisfaction.     Elspeth JONELLE Her

## 2024-01-02 NOTE — Transfer of Care (Signed)
 Immediate Anesthesia Transfer of Care Note  Patient: Lindsey Sanders  Procedure(s) Performed: ARTHROPLASTY, SHOULDER, TOTAL, REVERSE (Right: Shoulder)  Patient Location: PACU  Anesthesia Type:General  Level of Consciousness: awake, alert , and oriented  Airway & Oxygen Therapy: Patient Spontanous Breathing and Patient connected to face mask oxygen  Post-op Assessment: Report given to RN and Post -op Vital signs reviewed and stable  Post vital signs: Reviewed and stable  Last Vitals:  Vitals Value Taken Time  BP 150/68   Temp    Pulse 75 01/02/24 14:51  Resp 12   SpO2 98 % 01/02/24 14:51  Vitals shown include unfiled device data.  Last Pain:  Vitals:   01/02/24 1235  TempSrc:   PainSc: 0-No pain         Complications: No notable events documented.

## 2024-01-02 NOTE — Anesthesia Procedure Notes (Signed)
 Anesthesia Regional Block: Interscalene brachial plexus block   Pre-Anesthetic Checklist: , timeout performed,  Correct Patient, Correct Site, Correct Laterality,  Correct Procedure, Correct Position, site marked,  Risks and benefits discussed,  Surgical consent,  Pre-op evaluation,  At surgeon's request and post-op pain management  Laterality: Right  Prep: chloraprep       Needles:  Injection technique: Single-shot  Needle Type: Echogenic Needle     Needle Length: 5cm  Needle Gauge: 21     Additional Needles:   Narrative:  Start time: 01/02/2024 12:18 PM End time: 01/02/2024 12:21 PM Injection made incrementally with aspirations every 5 mL.  Performed by: Personally  Anesthesiologist: Lucious Debby BRAVO, MD  Additional Notes: No pain on injection. No increased resistance to injection. Injection made in 5cc increments. Good needle visualization. Patient tolerated the procedure well.

## 2024-01-02 NOTE — Brief Op Note (Signed)
 01/02/2024  2:51 PM  PATIENT:  Lindsey Sanders  78 y.o. female  PRE-OPERATIVE DIAGNOSIS:  Right shoulder rotator cuff insuffciency  POST-OPERATIVE DIAGNOSIS:  Right shoulder rotator cuff insuffciency  PROCEDURE:  Procedure(s): ARTHROPLASTY, SHOULDER, TOTAL, REVERSE (Right) DePuy Delta Xtend with no subscap repair  SURGEON:  Surgeons and Role:    DEWAINE Kay Kemps, MD - Primary  PHYSICIAN ASSISTANT:   ASSISTANTS: Lindsey KATHEE Fireman, PA-C   ANESTHESIA:   regional and general  EBL:  250 mL   BLOOD ADMINISTERED:none  DRAINS: none   LOCAL MEDICATIONS USED:  MARCAINE      SPECIMEN:  No Specimen  DISPOSITION OF SPECIMEN:  N/A  COUNTS:  YES  TOURNIQUET:  * No tourniquets in log *  DICTATION: .Other Dictation: Dictation Number [id number]  PLAN OF CARE: Admit for overnight observation  PATIENT DISPOSITION:  PACU - hemodynamically stable.   Delay start of Pharmacological VTE agent (>24hrs) due to surgical blood loss or risk of bleeding: not applicable

## 2024-01-03 ENCOUNTER — Other Ambulatory Visit (HOSPITAL_COMMUNITY): Payer: Self-pay

## 2024-01-03 DIAGNOSIS — M75101 Unspecified rotator cuff tear or rupture of right shoulder, not specified as traumatic: Secondary | ICD-10-CM | POA: Diagnosis not present

## 2024-01-03 LAB — BASIC METABOLIC PANEL WITH GFR
Anion gap: 9 (ref 5–15)
BUN: 21 mg/dL (ref 8–23)
CO2: 23 mmol/L (ref 22–32)
Calcium: 8.7 mg/dL — ABNORMAL LOW (ref 8.9–10.3)
Chloride: 105 mmol/L (ref 98–111)
Creatinine, Ser: 0.71 mg/dL (ref 0.44–1.00)
GFR, Estimated: >60 mL/min (ref >60)
Glucose, Bld: 158 mg/dL — ABNORMAL HIGH (ref 70–99)
Potassium: 4.1 mmol/L (ref 3.5–5.1)
Sodium: 137 mmol/L (ref 135–145)

## 2024-01-03 LAB — GLUCOSE, CAPILLARY: Glucose-Capillary: 176 mg/dL — ABNORMAL HIGH (ref 70–99)

## 2024-01-03 LAB — HEMOGLOBIN AND HEMATOCRIT, BLOOD
HCT: 31.8 % — ABNORMAL LOW (ref 36.0–46.0)
Hemoglobin: 9.6 g/dL — ABNORMAL LOW (ref 12.0–15.0)

## 2024-01-03 NOTE — Evaluation (Signed)
 Occupational Therapy Evaluation Patient Details Name: Lindsey Sanders MRN: [id number] DOB: Jan 05, 1946 Today's Date: 01/03/2024   History of Present Illness   78 y/o female s/p Right reverse total shoulder arthroplasty under Dr. Kay.     Clinical Impressions Pt is a 78 year old female, s/p r shoulder replacement without functional use of RT dominant upper extremity secondary to effects of surgery and interscalene block and shoulder precautions. Therapist provided education and instruction to patient in regards to exercises, precautions, positioning, donning upper extremity clothing and bathing while maintaining shoulder precautions, ice and edema management and donning/doffing sling. Patient verbalized understanding and demonstrated as needed. Patient needed assistance to donn overhead dress, underwear, doff socks and don shoes and provided with instruction on compensatory strategies to perform ADLs. Patient limited by decreased ROM in RT shoulder so therefore will need some form of assistance at home. Patient verbalized and/or demonstrated understanding to all instruction. Pt asked to see OT for discharge without spouse present. Patient to follow up with MD for further therapy needs.       If plan is discharge home, recommend the following:   A little help with walking and/or transfers;A lot of help with bathing/dressing/bathroom;Assistance with feeding;Assist for transportation;Help with stairs or ramp for entrance;Assistance with cooking/housework     Functional Status Assessment   Patient has had a recent decline in their functional status and demonstrates the ability to make significant improvements in function in a reasonable and predictable amount of time.     Equipment Recommendations   None recommended by OT     Recommendations for Other Services         Precautions/Restrictions   Precautions Precautions: Shoulder;Fall Type of Shoulder Precautions:  rTSA Shoulder Interventions: Shoulder sling/immobilizer;At all times;Off for dressing/bathing/exercises Precaution Booklet Issued: Yes (comment) Recall of Precautions/Restrictions: Intact Precaution/Restrictions Comments: Sling at all times except ADL/exercise Yes  Non weight bearing Yes  AROM elbow, wrist and hand to tolerance Yes  PROM of shoulder No  AROM of shoulder No  OT Consult Special Instructions ok for gentle ADLs Required Braces or Orthoses: Sling Restrictions Weight Bearing Restrictions Per Provider Order: Yes RUE Weight Bearing Per Provider Order: Non weight bearing     Mobility Bed Mobility Overal bed mobility: Modified Independent             General bed mobility comments: HOB elevated. Pt does have recliner at home if needed    Transfers Overall transfer level: Needs assistance Equipment used: 1 person hand held assist Transfers: Sit to/from Stand, Bed to chair/wheelchair/BSC Sit to Stand: Contact guard assist                  Balance Overall balance assessment: History of Falls, Needs assistance Sitting-balance support: No upper extremity supported, Feet supported Sitting balance-Leahy Scale: Good     Standing balance support: Single extremity supported, During functional activity Standing balance-Leahy Scale: Poor                             ADL either performed or assessed with clinical judgement   ADL Overall ADL's : Needs assistance/impaired Eating/Feeding: Set up   Grooming: Wash/dry hands;Standing;Supervision/safety;Adhering to UE precautions   Upper Body Bathing: Minimal assistance;Adhering to UE precautions;Cueing for compensatory techniques   Lower Body Bathing: Minimal assistance;Cueing for compensatory techniques   Upper Body Dressing : Moderate assistance;Adhering to UE precautions;Cueing for UE precautions;Cueing for compensatory techniques;Cueing for sequencing;Sitting   Lower Body Dressing:  Moderate  assistance;Sitting/lateral leans;Sit to/from stand;Cueing for compensatory techniques Lower Body Dressing Details (indicate cue type and reason): Pt educated on compensatory technique to don TED hose as well due to post-op LE edema. Toilet Transfer: Contact guard assist;Minimal assistance;Ambulation Toilet Transfer Details (indicate cue type and reason): OT acted as cane with Min HHA. Toileting- Clothing Manipulation and Hygiene: Supervision/safety       Functional mobility during ADLs: Minimal assistance;Contact guard assist;Supervision/safety       Vision Baseline Vision/History: 0 No visual deficits Vision Assessment?: No apparent visual deficits     Perception         Praxis         Pertinent Vitals/Pain Pain Assessment Pain Assessment: 0-10 Pain Score: 3  Pain Location: RT shoulder and RT side pain-feels like gas pains. Pt has not yet passed gas. Pain Descriptors / Indicators: Sharp Pain Intervention(s): Monitored during session, Limited activity within patient's tolerance, Premedicated before session     Extremity/Trunk Assessment Upper Extremity Assessment Upper Extremity Assessment: Right hand dominant;RUE deficits/detail;LUE deficits/detail RUE: Unable to fully assess due to immobilization RUE Sensation: decreased light touch RUE Coordination: decreased fine motor;decreased gross motor LUE Deficits / Details: Painful use since fall in August. ROM intact.       Cervical / Trunk Assessment Cervical / Trunk Assessment: Other exceptions Cervical / Trunk Exceptions: Body habitus.   Communication Communication Communication: No apparent difficulties   Cognition Arousal: Alert Behavior During Therapy: WFL for tasks assessed/performed Cognition: No apparent impairments                               Following commands: Intact       Cueing  General Comments   Cueing Techniques: Visual cues;Verbal cues      Exercises Other Exercises Other  Exercises: Pt educated on hand/wrist/forearm/elbow exercises and referred to handout for reinforcement.   Shoulder Instructions Shoulder Instructions Donning/doffing shirt without moving shoulder: Moderate assistance;Patient able to independently direct caregiver Method for sponge bathing under operated UE: Patient able to independently direct caregiver;Minimal assistance;Moderate assistance Donning/doffing sling/immobilizer: Moderate assistance;Patient able to independently direct caregiver Correct positioning of sling/immobilizer: Minimal assistance;Patient able to independently direct caregiver ROM for elbow, wrist and digits of operated UE: Independent;Patient able to independently direct caregiver Sling wearing schedule (on at all times/off for ADL's): Independent;Patient able to independently direct caregiver Proper positioning of operated UE when showering: Min-guard;Patient able to independently direct caregiver Positioning of UE while sleeping: Min-guard;Patient able to independently direct caregiver    Home Living Family/patient expects to be discharged to:: Private residence Living Arrangements: Spouse/significant other Available Help at Discharge: Family;Available 24 hours/day Type of Home: House Home Access: Stairs to enter [employer] of Steps: 4 Entrance Stairs-Rails: Right Home Layout: One level     Bathroom Shower/Tub: Producer, television/film/video: Handicapped height     Home Equipment: BSC/3in1;Hand held shower head   Additional Comments: Hurrycane at all times.      Prior Functioning/Environment Prior Level of Function : Driving;Working/employed;Independent/Modified Independent                    OT Problem List: Obesity;Impaired balance (sitting and/or standing);Impaired UE functional use;Pain   OT Treatment/Interventions:        OT Goals(Current goals can be found in the care plan section)   Acute Rehab OT Goals Patient Stated  Goal: Expressed hope that her husband will be able to adequately assist  pt at home. OT Goal Formulation: All assessment and education complete, DC therapy Potential to Achieve Goals: Good ADL Goals Additional ADL Goal #1: Pt will demonstrate UE/LE dressing, donning/doffing of sling, correct positioning of RT UE, and compensatory strategies for RT axilla hygiene, as well as use of Ice, while correctly following all shoulder post-op precautions/restrictions.   OT Frequency:       Co-evaluation              AM-PAC OT 6 Clicks Daily Activity     Outcome Measure Help from another person eating meals?: A Little Help from another person taking care of personal grooming?: A Little Help from another person toileting, which includes using toliet, bedpan, or urinal?: A Little Help from another person bathing (including washing, rinsing, drying)?: A Lot Help from another person to put on and taking off regular upper body clothing?: A Lot Help from another person to put on and taking off regular lower body clothing?: A Lot 6 Click Score: 15   End of Session Equipment Utilized During Treatment:  (sling) Nurse Communication: Other (comment) (Coordinated time of OT visit with RN)  Activity Tolerance: Patient tolerated treatment well Patient left: with call bell/phone within reach (EOB)  OT Visit Diagnosis: Unsteadiness on feet (R26.81);Pain;History of falling (Z91.81) Pain - Right/Left: Right Pain - part of body: Shoulder (and RT flank)                Time: 9163-9089 OT Time Calculation (min): 34 min Charges:  OT General Charges $OT Visit: 1 Visit OT Evaluation $OT Eval Low Complexity: 1 Low OT Treatments $Self Care/Home Management : 8-22 mins  Delon, OT Acute Rehab Services Office: [phone] 01/03/2024   Delon Falter 01/03/2024, 9:26 AM

## 2024-01-03 NOTE — Progress Notes (Signed)
 Orthopedics Progress Note  Subjective: Patient with no pain this morning. Block still working  Objective:  Vitals:   01/03/24 0140 01/03/24 0525  BP: (!) 151/68 127/61  Pulse: 73 87  Resp: 16 15  Temp: 97.8 F (36.6 C) 97.7 F (36.5 C)  SpO2: 92% 91%    General: Awake and alert  Musculoskeletal: Right shoulder dressing changed to Aquacel, min swelling Neurovascularly intact  Lab Results  Component Value Date   WBC 6.4 12/26/2023   HGB 9.6 (L) 01/03/2024   HCT 31.8 (L) 01/03/2024   MCV 91.6 12/26/2023   PLT 224 12/26/2023       Component Value Date/Time   NA 137 01/03/2024 0408   K 4.1 01/03/2024 0408   CL 105 01/03/2024 0408   CO2 23 01/03/2024 0408   GLUCOSE 158 (H) 01/03/2024 0408   BUN 21 01/03/2024 0408   CREATININE 0.71 01/03/2024 0408   CALCIUM 8.7 (L) 01/03/2024 0408   GFRNONAA >60 01/03/2024 0408   GFRAA >60 03/23/2018 0206    No results found for: INR, PROTIME  Assessment/Plan: POD #1 s/p Procedure(s): ARTHROPLASTY, SHOULDER, TOTAL, REVERSE Home after OT. Follow up in two weeks  Elspeth SAUNDERS. Kay, MD 01/03/2024 8:08 AM

## 2024-01-03 NOTE — Discharge Summary (Signed)
 In most cases prophylactic antibiotics for Dental procdeures after total joint surgery are not necessary.  Exceptions are as follows:  1. History of prior total joint infection  2. Severely immunocompromised (Organ Transplant, cancer chemotherapy, Rheumatoid biologic meds such as Humera)  3. Poorly controlled diabetes (A1C &gt; 8.0, blood glucose over 200)  If you have one of these conditions, contact your surgeon for an antibiotic prescription, prior to your dental procedure. Orthopedic Discharge Summary        Physician Discharge Summary  Patient ID: Lindsey Sanders MRN: [id number] DOB/AGE: 73/12/1945 78 y.o.  Admit date: 01/02/2024 Discharge date: 01/03/2024   Procedures:  Procedure(s) (LRB): ARTHROPLASTY, SHOULDER, TOTAL, REVERSE (Right)  Attending Physician:  Dr. Elspeth Her  Admission Diagnoses:   Right shoulder end stage RCT arthropathy  Discharge Diagnoses:  same   Past Medical History:  Diagnosis Date   Anxiety    Arthritis    Cancer of skin of chest    Complication of anesthesia    Depression    Diabetes mellitus without complication (HCC)    Dyspnea    Family history of adverse reaction to anesthesia    sister has nausea and vomiting after surgery   History of kidney stones    Hypertension    Morbid obesity (HCC)    PONV (postoperative nausea and vomiting)    Sleep apnea    No CPAP    PCP: Verdia Lombard, MD   Discharged Condition: good  Hospital Course:  Patient underwent the above stated procedure on 01/02/2024. Patient tolerated the procedure well and brought to the recovery room in good condition and subsequently to the floor. Patient had an uncomplicated hospital course and was stable for discharge.   Disposition: Discharge disposition: 01-Home or Self Care      with follow up in 2 weeks    Follow-up Information     Her Kemps, MD. Call in 2 week(s).   Specialty: Orthopedic Surgery Why: please call for appt in two  weeks, call [phone] Contact information: [address] [place] [address] [phone]                 Dental Antibiotics:  In most cases prophylactic antibiotics for Dental procdeures after total joint surgery are not necessary.  Exceptions are as follows:  1. History of prior total joint infection  2. Severely immunocompromised (Organ Transplant, cancer chemotherapy, Rheumatoid biologic meds such as Humera)  3. Poorly controlled diabetes (A1C &gt; 8.0, blood glucose over 200)  If you have one of these conditions, contact your surgeon for an antibiotic prescription, prior to your dental procedure.  Discharge Instructions     Call MD / Call 911   Complete by: As directed    If you experience chest pain or shortness of breath, CALL 911 and be transported to the hospital emergency room.  If you develope a fever above 101 F, pus (white drainage) or increased drainage or redness at the wound, or calf pain, call your surgeon's office.   Constipation Prevention   Complete by: As directed    Drink plenty of fluids.  Prune juice may be helpful.  You may use a stool softener, such as Colace (over the counter) 100 mg twice a day.  Use MiraLax (over the counter) for constipation as needed.   Diet - low sodium heart healthy   Complete by: As directed    Increase activity slowly as tolerated   Complete by: As directed  Post-operative opioid taper instructions:   Complete by: As directed    POST-OPERATIVE OPIOID TAPER INSTRUCTIONS: It is important to wean off of your opioid medication as soon as possible. If you do not need pain medication after your surgery it is ok to stop day one. Opioids include: Codeine, Hydrocodone(Norco, Vicodin), Oxycodone (Percocet, oxycontin ) and hydromorphone  amongst others.  Long term and even short term use of opiods can cause: Increased pain response Dependence Constipation Depression Respiratory depression And more.   Withdrawal symptoms can include Flu like symptoms Nausea, vomiting And more Techniques to manage these symptoms Hydrate well Eat regular healthy meals Stay active Use relaxation techniques(deep breathing, meditating, yoga) Do Not substitute Alcohol to help with tapering If you have been on opioids for less than two weeks and do not have pain than it is ok to stop all together.  Plan to wean off of opioids This plan should start within one week post op of your joint replacement. Maintain the same interval or time between taking each dose and first decrease the dose.  Cut the total daily intake of opioids by one tablet each day Next start to increase the time between doses. The last dose that should be eliminated is the evening dose.          Allergies as of 01/03/2024       Reactions   Jardiance [empagliflozin] Shortness Of Breath   Penicillins Other (See Comments)   Faint, passes out Has patient had a PCN reaction causing immediate rash, facial/tongue/throat swelling, SOB or lightheadedness with hypotension:No Has patient had a PCN reaction causing severe rash involving mucus membranes or skin necrosis:No Has patient had a PCN reaction that required hospitalization:No Has patient had a PCN reaction occurring within the last 10 years:No If all of the above answers are NO, then may proceed with Cephalosporin use.   Metformin Diarrhea, Nausea And Vomiting        Medication List     TAKE these medications    albuterol  108 (90 Base) MCG/ACT inhaler Commonly known as: VENTOLIN  HFA Inhale 1 puff into the lungs every 6 (six) hours as needed for wheezing or shortness of breath.   budesonide -formoterol  160-4.5 MCG/ACT inhaler Commonly known as: SYMBICORT  Inhale 2 puffs into the lungs 2 (two) times daily as needed (for respiratory issues.).   chlorhexidine  4 % external liquid Commonly known as: HIBICLENS  Apply 15 mLs (1 Application total) topically as directed for 30  doses. Use as directed daily for 5 days every other week for 6 weeks.   escitalopram  20 MG tablet Commonly known as: LEXAPRO  Take 20 mg by mouth daily.   ezetimibe 10 MG tablet Commonly known as: ZETIA Take 10 mg by mouth daily.   furosemide 20 MG tablet Commonly known as: LASIX Take 20 mg by mouth daily as needed for fluid.   HYDROcodone-acetaminophen  5-325 MG tablet Commonly known as: NORCO/VICODIN Take 1 tablet by mouth every 6 (six) hours as needed for moderate pain (pain score 4-6).   irbesartan-hydrochlorothiazide  150-12.5 MG tablet Commonly known as: AVALIDE Take 1 tablet by mouth daily.   Januvia 100 MG tablet Generic drug: sitaGLIPtin Take 100 mg by mouth daily.   Magnesium 200 MG Tabs Take 200 mg by mouth daily.   meclizine 25 MG tablet Commonly known as: ANTIVERT Take 25 mg by mouth 2 (two) times daily as needed for dizziness or nausea.   montelukast 10 MG tablet Commonly known as: SINGULAIR Take 10 mg by mouth at bedtime.   mupirocin  ointment 2 % Commonly known as: BACTROBAN Place 1 Application into the nose 2 (two) times daily for 60 doses. Use as directed 2 times daily for 5 days every other week for 6 weeks.   pantoprazole  40 MG tablet Commonly known as: Protonix  Take 1 tablet (40 mg total) by mouth daily. What changed:  when to take this reasons to take this          Signed: Elspeth JONELLE Her 01/03/2024, 8:10 AM  [facility] is now [facility] [address], [place], [place] [address] Phone: [phone] Facebook  Instagram  [employer]

## 2024-01-05 ENCOUNTER — Encounter (HOSPITAL_COMMUNITY): Payer: Self-pay | Admitting: Orthopedic Surgery
